# Patient Record
Sex: Female | Born: 1960 | Race: Black or African American | Hispanic: No | Marital: Married | Smoking: Former smoker
Health system: Southern US, Community
[De-identification: ages and names within clinical notes are randomized; demographics above are authoritative.]

## PROBLEM LIST (undated history)

## (undated) DIAGNOSIS — J189 Pneumonia, unspecified organism: Secondary | ICD-10-CM

## (undated) DIAGNOSIS — E119 Type 2 diabetes mellitus without complications: Secondary | ICD-10-CM

## (undated) DIAGNOSIS — G4730 Sleep apnea, unspecified: Secondary | ICD-10-CM

## (undated) DIAGNOSIS — D649 Anemia, unspecified: Secondary | ICD-10-CM

## (undated) DIAGNOSIS — I10 Essential (primary) hypertension: Secondary | ICD-10-CM

## (undated) HISTORY — PX: CHOLECYSTECTOMY: SHX55

## (undated) HISTORY — PX: TONSILLECTOMY: SUR1361

## (undated) HISTORY — PX: TUBAL LIGATION: SHX77

## (undated) HISTORY — DX: Anemia, unspecified: D64.9

## (undated) HISTORY — DX: Type 2 diabetes mellitus without complications: E11.9

---

## 2003-04-30 ENCOUNTER — Encounter: Payer: Self-pay | Admitting: Emergency Medicine

## 2003-04-30 ENCOUNTER — Emergency Department (HOSPITAL_COMMUNITY): Admission: EM | Admit: 2003-04-30 | Discharge: 2003-04-30 | Payer: Self-pay | Admitting: Emergency Medicine

## 2008-10-21 ENCOUNTER — Emergency Department (HOSPITAL_BASED_OUTPATIENT_CLINIC_OR_DEPARTMENT_OTHER): Admission: EM | Admit: 2008-10-21 | Discharge: 2008-10-21 | Payer: Self-pay | Admitting: Emergency Medicine

## 2009-08-26 ENCOUNTER — Encounter: Payer: Self-pay | Admitting: Emergency Medicine

## 2009-08-26 ENCOUNTER — Observation Stay (HOSPITAL_COMMUNITY): Admission: EM | Admit: 2009-08-26 | Discharge: 2009-08-27 | Payer: Self-pay | Admitting: Internal Medicine

## 2009-08-26 ENCOUNTER — Ambulatory Visit: Payer: Self-pay | Admitting: Diagnostic Radiology

## 2010-11-03 LAB — CARDIAC PANEL(CRET KIN+CKTOT+MB+TROPI)
CK, MB: 0.9 ng/mL (ref 0.3–4.0)
Relative Index: INVALID (ref 0.0–2.5)
Total CK: 47 U/L (ref 7–177)
Total CK: 52 U/L (ref 7–177)
Troponin I: 0.01 ng/mL (ref 0.00–0.06)

## 2010-11-03 LAB — RETICULOCYTES: RBC.: 4.88 MIL/uL (ref 3.87–5.11)

## 2010-11-03 LAB — BASIC METABOLIC PANEL
Creatinine, Ser: 0.8 mg/dL (ref 0.4–1.2)
GFR calc Af Amer: >60 mL/min (ref >60)
Potassium: 4.3 mEq/L (ref 3.5–5.1)

## 2010-11-03 LAB — LIPID PANEL
Cholesterol: 112 mg/dL (ref 0–200)
LDL Cholesterol: 66 mg/dL (ref 0–99)
Triglycerides: 64 mg/dL (ref <150)
VLDL: 13 mg/dL (ref 0–40)

## 2010-11-03 LAB — CBC
HCT: 34.9 % — ABNORMAL LOW (ref 36.0–46.0)
Hemoglobin: 11.3 g/dL — ABNORMAL LOW (ref 12.0–15.0)
MCHC: 31.9 g/dL (ref 30.0–36.0)
MCHC: 31.5 g/dL (ref 30.0–36.0)
MCV: 67.4 fL — ABNORMAL LOW (ref 78.0–100.0)
MCV: 68.5 fL — ABNORMAL LOW (ref 78.0–100.0)
Platelets: 253 10*3/uL (ref 150–400)
Platelets: 243 10*3/uL (ref 150–400)
RBC: 5.26 MIL/uL — ABNORMAL HIGH (ref 3.87–5.11)
RDW: 17.1 % — ABNORMAL HIGH (ref 11.5–15.5)
RDW: 18.9 % — ABNORMAL HIGH (ref 11.5–15.5)
WBC: 12.6 10*3/uL — ABNORMAL HIGH (ref 4.0–10.5)

## 2010-11-03 LAB — IRON AND TIBC
Iron: 21 ug/dL — ABNORMAL LOW (ref 42–135)
Saturation Ratios: 7 % — ABNORMAL LOW (ref 20–55)
UIBC: 266 ug/dL

## 2010-11-03 LAB — DIFFERENTIAL
Basophils Absolute: 0.1 10*3/uL (ref 0.0–0.1)
Eosinophils Absolute: 0.5 10*3/uL (ref 0.0–0.7)
Lymphocytes Relative: 31 % (ref 12–46)
Monocytes Absolute: 0.8 10*3/uL (ref 0.1–1.0)
Neutro Abs: 7.3 10*3/uL (ref 1.7–7.7)

## 2010-11-03 LAB — POCT CARDIAC MARKERS
CKMB, poc: <1 ng/mL — ABNORMAL LOW (ref 1.0–8.0)
Myoglobin, poc: 33.2 ng/mL (ref 12–200)
Myoglobin, poc: 60.2 ng/mL (ref 12–200)
Troponin i, poc: <0.05 ng/mL (ref 0.00–0.09)

## 2010-11-03 LAB — VITAMIN B12: Vitamin B-12: 309 pg/mL (ref 211–911)

## 2010-11-03 LAB — FERRITIN: Ferritin: 23 ng/mL (ref 10–291)

## 2010-11-28 LAB — CBC
Hemoglobin: 12 g/dL (ref 12.0–15.0)
MCHC: 31.7 g/dL (ref 30.0–36.0)
Platelets: 264 10*3/uL (ref 150–400)
RDW: 17.9 % — ABNORMAL HIGH (ref 11.5–15.5)

## 2010-11-28 LAB — GC/CHLAMYDIA PROBE AMP, GENITAL: GC Probe Amp, Genital: NEGATIVE

## 2010-11-28 LAB — URINALYSIS, ROUTINE W REFLEX MICROSCOPIC
Ketones, ur: NEGATIVE mg/dL
Nitrite: NEGATIVE
Protein, ur: NEGATIVE mg/dL
Urobilinogen, UA: 1 mg/dL (ref 0.0–1.0)

## 2010-11-28 LAB — PREGNANCY, URINE: Preg Test, Ur: NEGATIVE

## 2010-11-28 LAB — WET PREP, GENITAL

## 2015-11-15 DIAGNOSIS — K219 Gastro-esophageal reflux disease without esophagitis: Secondary | ICD-10-CM | POA: Insufficient documentation

## 2015-11-15 DIAGNOSIS — J45909 Unspecified asthma, uncomplicated: Secondary | ICD-10-CM

## 2015-11-15 DIAGNOSIS — G4733 Obstructive sleep apnea (adult) (pediatric): Secondary | ICD-10-CM | POA: Insufficient documentation

## 2015-11-15 DIAGNOSIS — R002 Palpitations: Secondary | ICD-10-CM | POA: Insufficient documentation

## 2015-11-15 DIAGNOSIS — R4701 Aphasia: Secondary | ICD-10-CM | POA: Insufficient documentation

## 2015-11-15 HISTORY — DX: Gastro-esophageal reflux disease without esophagitis: K21.9

## 2015-11-15 HISTORY — DX: Unspecified asthma, uncomplicated: J45.909

## 2015-11-26 DIAGNOSIS — N926 Irregular menstruation, unspecified: Secondary | ICD-10-CM | POA: Insufficient documentation

## 2016-10-24 DIAGNOSIS — M170 Bilateral primary osteoarthritis of knee: Secondary | ICD-10-CM | POA: Insufficient documentation

## 2016-10-24 DIAGNOSIS — G8929 Other chronic pain: Secondary | ICD-10-CM | POA: Insufficient documentation

## 2016-10-26 ENCOUNTER — Encounter (HOSPITAL_BASED_OUTPATIENT_CLINIC_OR_DEPARTMENT_OTHER): Payer: Self-pay | Admitting: Emergency Medicine

## 2016-10-26 ENCOUNTER — Emergency Department (HOSPITAL_BASED_OUTPATIENT_CLINIC_OR_DEPARTMENT_OTHER)
Admission: EM | Admit: 2016-10-26 | Discharge: 2016-10-26 | Disposition: A | Discharge status: Home / Self Care | Payer: BLUE CROSS/BLUE SHIELD | Attending: Emergency Medicine | Admitting: Emergency Medicine

## 2016-10-26 DIAGNOSIS — L509 Urticaria, unspecified: Secondary | ICD-10-CM | POA: Insufficient documentation

## 2016-10-26 DIAGNOSIS — T39395A Adverse effect of other nonsteroidal anti-inflammatory drugs [NSAID], initial encounter: Secondary | ICD-10-CM | POA: Diagnosis not present

## 2016-10-26 DIAGNOSIS — Z87891 Personal history of nicotine dependence: Secondary | ICD-10-CM | POA: Insufficient documentation

## 2016-10-26 DIAGNOSIS — T7840XA Allergy, unspecified, initial encounter: Secondary | ICD-10-CM

## 2016-10-26 MED ORDER — PREDNISONE 20 MG PO TABS: ORAL_TABLET | ORAL | 0 refills | Status: DC

## 2016-10-26 MED ORDER — FAMOTIDINE IN NACL 20-0.9 MG/50ML-% IV SOLN
20.0000 mg | Freq: Once | INTRAVENOUS | Status: AC
  Administered 2016-10-26: 20 mg via INTRAVENOUS
  Filled 2016-10-26: qty 50

## 2016-10-26 MED ORDER — RANITIDINE HCL 150 MG PO TABS: 150.0000 mg | ORAL_TABLET | Freq: Two times a day (BID) | ORAL | 0 refills | Status: DC

## 2016-10-26 MED ORDER — DIPHENHYDRAMINE HCL 25 MG PO TABS: 50.0000 mg | ORAL_TABLET | Freq: Four times a day (QID) | ORAL | 0 refills | Status: DC | PRN

## 2016-10-26 MED ORDER — DIPHENHYDRAMINE HCL 50 MG/ML IJ SOLN
50.0000 mg | Freq: Once | INTRAMUSCULAR | Status: AC
  Administered 2016-10-26: 50 mg via INTRAVENOUS
  Filled 2016-10-26: qty 1

## 2016-10-26 MED ORDER — METHYLPREDNISOLONE SODIUM SUCC 125 MG IJ SOLR
125.0000 mg | Freq: Once | INTRAMUSCULAR | Status: AC
  Administered 2016-10-26: 125 mg via INTRAVENOUS
  Filled 2016-10-26: qty 2

## 2016-10-26 NOTE — Discharge Instructions (Signed)
Take Prednisone, Benadryl, and zantac as prescribed (starting prednisone tomorrow, but the rest can begin today). Continue your usual home medications. Get plenty of rest and drink plenty of fluids. Avoid any known triggers. STOP TAKING MELOXICAM! Please followup with your primary doctor in 3-5 days for recheck of symptoms. Return to the ER for changes or worsening symptoms

## 2016-10-26 NOTE — ED Triage Notes (Signed)
Pt sts her face was hot and lips tingling last night after taking Meloxicam; sxs continued this a.m. [facility]

## 2016-10-26 NOTE — ED Provider Notes (Signed)
[facility] Provider Note   CSN: [id number] Arrival date & time: 10/26/16  4098     History   Chief Complaint Chief Complaint  Patient presents with  . Allergic Reaction    HPI Hannah Figueroa is a 56 y.o. female with a PMHx of GERD, asthma, morbid obesity, OSA, and b/l knee pain, who presents to the ED with complaints of allergic reaction after taking mobic around 2 PM yesterday afternoon. Patient states that about 2 hours after taking Mobic, she developed upper chest and face itching and burning and a rash/hives, as well as bilateral upper eyelid and lip swelling. Today she continues to have the symptoms, and has noticed some mild shortness of breath and wheezing. She has not tried anything for her symptoms, and no known aggravating factors aside from taking mobic. She has had an allergic reaction to antibiotic when she was a teenager, cannot recall the name of the antibiotic, but recalls that it was not the same as this reaction given that she did not have any facial swelling with that reaction. She denies any other changes in soaps, detergents, lotions, creams, cosmetics, animal or plant contact, foods, or any other changes in her daily routine aside from the new medication. She denies any tongue swelling, drooling, trismus, eye drainage or redness, vision changes, fevers, chills, CP, abd pain, N/V/D/C, hematuria, dysuria, myalgias, arthralgias, numbness, tingling, focal weakness, or any other complaints at this time.    The history is provided by the patient and medical records. No language interpreter was used.  Allergic Reaction  Presenting symptoms: itching, rash, swelling and wheezing   Presenting symptoms: no difficulty swallowing   Severity:  Mild Duration:  20 hours Prior allergic episodes:  Allergies to medications Context: medications   Context: not animal exposure, not cosmetics, not food allergies and not new detergents/soaps   Relieved by:  None tried Worsened  by:  Nothing Ineffective treatments:  None tried   History reviewed. No pertinent past medical history.  There are no active problems to display for this patient.   Past Surgical History:  Procedure Laterality Date  . CESAREAN SECTION    . CHOLECYSTECTOMY      OB History    No data available       Home Medications    Prior to Admission medications   Not on File    Family History History reviewed. No pertinent family history.  Social History Social History  Substance Use Topics  . Smoking status: Former Games developer  . Smokeless tobacco: Never Used  . Alcohol use No     Comment: soc     Allergies   Meloxicam   Review of Systems Review of Systems  Constitutional: Negative for chills and fever.  HENT: Positive for facial swelling. Negative for drooling and trouble swallowing.   Eyes: Negative for discharge, redness and visual disturbance.  Respiratory: Positive for shortness of breath and wheezing.   Cardiovascular: Negative for chest pain.  Gastrointestinal: Negative for abdominal pain, constipation, diarrhea, nausea and vomiting.  Genitourinary: Negative for dysuria and hematuria.  Musculoskeletal: Negative for arthralgias and myalgias.  Skin: Positive for itching and rash.  Allergic/Immunologic: Negative for immunocompromised state.  Neurological: Negative for weakness and numbness.  Psychiatric/Behavioral: Negative for confusion.   10 Systems reviewed and are negative for acute change except as noted in the HPI.   Physical Exam Updated Vital Signs BP 198/96   Pulse 87   Temp 98.1 F (36.7 C) (Oral)   Resp 20  Ht 5\' 5"  (1.651 m)   Wt (!) 172.4 kg   LMP 10/19/2016   SpO2 100%   BMI 63.24 kg/m   Re-check VS: BP (!) 133/111 (BP Location: Right Wrist) Comment: pt laughing during BP cuff infaltion  Pulse 80   Temp 98.1 F (36.7 C) (Oral)   Resp 20   Ht 5\' 5"  (1.651 m)   Wt (!) 172.4 kg   LMP 10/19/2016   SpO2 99%   BMI 63.24 kg/m     Physical Exam  Constitutional: She is oriented to person, place, and time. She appears well-developed and well-nourished.  Non-toxic appearance. No distress.  Afebrile, nontoxic, NAD, BP elevated initially  HENT:  Head: Normocephalic and atraumatic.  Nose: Nose normal.  Mouth/Throat: Uvula is midline, oropharynx is clear and moist and mucous membranes are normal. No trismus in the jaw. No uvula swelling.  Bilateral upper eyelids mildly swollen, no other significant facial swelling noted, tongue and lips unremarkable appearing without significant swelling. Airway patent. Nose clear. Oropharynx clear and moist, without uvular swelling or deviation, no trismus or drooling, no tonsillar swelling or erythema, no exudates.    Eyes: Conjunctivae and EOM are normal. Pupils are equal, round, and reactive to light. Right eye exhibits no chemosis and no discharge. Left eye exhibits no chemosis and no discharge.  B/l upper eyelids mildly swollen. Conjunctiva clear bilaterally. PERRL, EOMI, no nystagmus. No ocular drainage  Neck: Normal range of motion. Neck supple.  Cardiovascular: Normal rate, regular rhythm, normal heart sounds and intact distal pulses.  Exam reveals no gallop and no friction rub.   No murmur heard. Pulmonary/Chest: Effort normal and breath sounds normal. No respiratory distress. She has no decreased breath sounds. She has no wheezes. She has no rhonchi. She has no rales.  CTAB in all lung fields, no w/r/r, no hypoxia or increased WOB, speaking in full sentences, SpO2 100% on RA   Abdominal: Soft. Normal appearance and bowel sounds are normal. She exhibits no distension. There is no tenderness. There is no rigidity, no rebound, no guarding, no CVA tenderness, no tenderness at McBurney's point and negative Murphy's sign.  Musculoskeletal: Normal range of motion.  Neurological: She is alert and oriented to person, place, and time. She has normal strength. No sensory deficit.  Skin: Skin is  warm, dry and intact. Rash noted. Rash is urticarial.  Faint urticarial rash to upper chest and neck  Psychiatric: She has a normal mood and affect.  Nursing note and vitals reviewed.    ED Treatments / Results  Labs (all labs ordered are listed, but only abnormal results are displayed) Labs Reviewed - No data to display  EKG  EKG Interpretation None       Radiology No results found.  Procedures Procedures (including critical care time)  Medications Ordered in ED Medications  methylPREDNISolone sodium succinate (SOLU-MEDROL) 125 mg/2 mL injection 125 mg (125 mg Intravenous Given 10/26/16 0959)  famotidine (PEPCID) IVPB 20 mg premix (0 mg Intravenous Stopped 10/26/16 1048)  diphenhydrAMINE (BENADRYL) injection 50 mg (50 mg Intravenous Given 10/26/16 0955)     Initial Impression / Assessment and Plan / ED Course  I have reviewed the triage vital signs and the nursing notes.  Pertinent labs & imaging results that were available during my care of the patient were reviewed by me and considered in my medical decision making (see chart for details).     56 y.o. female here with allergic reaction after taking mobic yesterday. Rash and itching/burning  to face and upper chest/neck, slight urticarial rash to these areas, mild swelling to upper eyelids bilaterally. Airway patent, clear lung sounds, no hypoxia, no tongue swelling noted. BP noted to be elevated at 198/96, no prior BP recordings this high from outpatient records, highest recorded was recently 142/82. Could be from anxiety of situation, will allow to relax for a few minutes and recheck. Will give solumedrol, benadryl, and pepcid, then reassess shortly.   12:35 PM BP 130s/100s on recheck, pt laughing during BP check, but then rechecked at 131/65 on subsequent checks; overall improved from prior so doubt that initial BP was truly accurate or any cause for concern. Overall symptoms not worsening, airway still patent, handling  secretions well, no progression of symptoms. Will d/c home with prednisone x4 more days starting tomorrow, and zantac/benadryl to take for total of 5 days. Discussed stopping mobic, and avoiding triggers. Strict return precautions advised. I explained the diagnosis and have given explicit precautions to return to the ER including for any other new or worsening symptoms. The patient understands and accepts the medical plan as it's been dictated and I have answered their questions. Discharge instructions concerning home care and prescriptions have been given. The patient is STABLE and is discharged to home in good condition.   Final Clinical Impressions(s) / ED Diagnoses   Final diagnoses:  Allergic reaction to drug, initial encounter  Hives    New Prescriptions New Prescriptions   DIPHENHYDRAMINE (BENADRYL) 25 MG TABLET    Take 2 tablets (50 mg total) by mouth every 6 (six) hours as needed for itching or allergies.   PREDNISONE (DELTASONE) 20 MG TABLET    3 tabs po daily x 4 days starting 10/27/16   RANITIDINE (ZANTAC) 150 MG TABLET    Take 1 tablet (150 mg total) by mouth 2 (two) times daily. x5 days     [id number]Zebulan Hinshaw, PA-C 10/26/16 1236    Pricilla LovelessScott Goldston, MD 10/29/16 [phone]

## 2017-02-28 ENCOUNTER — Emergency Department (HOSPITAL_BASED_OUTPATIENT_CLINIC_OR_DEPARTMENT_OTHER)
Admission: EM | Admit: 2017-02-28 | Discharge: 2017-02-28 | Disposition: A | Discharge status: Home / Self Care | Payer: BLUE CROSS/BLUE SHIELD | Attending: Emergency Medicine | Admitting: Emergency Medicine

## 2017-02-28 ENCOUNTER — Encounter (HOSPITAL_BASED_OUTPATIENT_CLINIC_OR_DEPARTMENT_OTHER): Payer: Self-pay | Admitting: Emergency Medicine

## 2017-02-28 DIAGNOSIS — Z87891 Personal history of nicotine dependence: Secondary | ICD-10-CM | POA: Insufficient documentation

## 2017-02-28 DIAGNOSIS — W57XXXA Bitten or stung by nonvenomous insect and other nonvenomous arthropods, initial encounter: Secondary | ICD-10-CM | POA: Diagnosis not present

## 2017-02-28 DIAGNOSIS — L03115 Cellulitis of right lower limb: Secondary | ICD-10-CM | POA: Diagnosis not present

## 2017-02-28 DIAGNOSIS — M25571 Pain in right ankle and joints of right foot: Secondary | ICD-10-CM | POA: Diagnosis present

## 2017-02-28 MED ORDER — TRIAMCINOLONE ACETONIDE 0.1 % EX CREA: 1.0000 "application " | TOPICAL_CREAM | Freq: Two times a day (BID) | CUTANEOUS | 0 refills | Status: DC

## 2017-02-28 MED ORDER — SULFAMETHOXAZOLE-TRIMETHOPRIM 800-160 MG PO TABS
1.0000 | ORAL_TABLET | Freq: Once | ORAL | Status: AC
  Administered 2017-02-28: 1 via ORAL
  Filled 2017-02-28: qty 1

## 2017-02-28 MED ORDER — SULFAMETHOXAZOLE-TRIMETHOPRIM 800-160 MG PO TABS
1.0000 | ORAL_TABLET | Freq: Two times a day (BID) | ORAL | 0 refills | Status: AC
Start: 2017-02-28 — End: 2017-03-07

## 2017-02-28 MED ORDER — LIDOCAINE-EPINEPHRINE (PF) 2 %-1:200000 IJ SOLN
10.0000 mL | Freq: Once | INTRAMUSCULAR | Status: AC
  Administered 2017-02-28: 10 mL via INTRADERMAL
  Filled 2017-02-28: qty 10

## 2017-02-28 MED ORDER — HYDROCODONE-ACETAMINOPHEN 5-325 MG PO TABS
1.0000 | ORAL_TABLET | Freq: Once | ORAL | Status: AC
Start: 2017-02-28 — End: 2017-02-28
  Administered 2017-02-28: 1 via ORAL
  Filled 2017-02-28: qty 1

## 2017-02-28 NOTE — ED Triage Notes (Signed)
Pt c/o pain and itching to RT ankle after spider bite on Thurs

## 2017-02-28 NOTE — Discharge Instructions (Signed)
Please take all of your antibiotics until finished!   You may develop abdominal discomfort or diarrhea from the antibiotic.  You may help offset this with probiotics which you can buy or get in yogurt. Do not eat  or take the probiotics until 2 hours after your antibiotic.   Keep the area clean and dry. Apply steroid cream twice daily for itching. You may also take Benadryl as needed for itching. Apply ice or heat to the affected area for comfort. Alternate 600 mg of ibuprofen and [phone] mg of Tylenol every 3 hours as needed for pain. Do not exceed 4000 mg of Tylenol daily. Follow-up with your primary care physician in 3 days for reevaluation. Return to the ED immediately if you develop any concerning signs or symptoms such as worsening/spreading of the swelling or redness, fevers, or abnormal drainage.

## 2017-02-28 NOTE — ED Provider Notes (Signed)
[facility] Note   CSN: [id number] Arrival date & time: 02/28/17  1833   By signing my name below, I, [name], attest that this documentation has been prepared under the direction and in the presence of Michela Pitcher, PA-C Electronically Signed: [name], ED Scribe. 02/28/17. 8:48 PM.  History   Chief Complaint Chief Complaint  Patient presents with  . Insect Bite    HPI Hannah Figueroa is a 56 y.o. female who presents to the Emergency Department complaining of possible insect bite to her right ankle onset 2 days ago. Pt reports associated right ankle pain, drainage localized to right ankle, pruritis to right ankle, right foot pain, right ankle swelling, and nausea. Pt has tried OTC abx ointment, witch hazel, and rubbing alcohol with no relief of her symptoms. She notes that she is unsure of what she was bit by, but felt an insect to her right ankle prior to the onset of her symptoms. She notes that her right ankle pain radiates to her right toes and calf. She denies numbness, tingling, color change, vomiting, and any other symptoms.    The history is provided by the patient. No language interpreter was used.    History reviewed. No pertinent past medical history.  There are no active problems to display for this patient.   Past Surgical History:  Procedure Laterality Date  . CESAREAN SECTION    . CHOLECYSTECTOMY      OB History    No data available       Home Medications    Prior to Admission medications   Medication Sig Start Date End Date Taking? Authorizing Provider  diphenhydrAMINE (BENADRYL) 25 MG tablet Take 2 tablets (50 mg total) by mouth every 6 (six) hours as needed for itching or allergies. 10/26/16 10/31/16  Street, Mercedes, PA-C  predniSONE (DELTASONE) 20 MG tablet 3 tabs po daily x 4 days starting 10/27/16 10/26/16   Street, [place], PA-C  ranitidine (ZANTAC) 150 MG tablet Take 1 tablet (150 mg total) by mouth 2 (two) times daily.  x5 days 10/26/16 10/31/16  Street, [place], PA-C  sulfamethoxazole-trimethoprim (BACTRIM DS,SEPTRA DS) 800-160 MG tablet Take 1 tablet by mouth 2 (two) times daily. 02/28/17 03/07/17  Michela Pitcher A, PA-C  triamcinolone cream (KENALOG) 0.1 % Apply 1 application topically 2 (two) times daily. 02/28/17   Jeanie Sewer, PA-C    Family History No family history on file.  Social History Social History  Substance Use Topics  . Smoking status: Former Games developer  . Smokeless tobacco: Never Used  . Alcohol use No     Comment: soc     Allergies   Meloxicam and Penicillins   Review of Systems Review of Systems  Gastrointestinal: Positive for nausea. Negative for vomiting.  Musculoskeletal: Positive for arthralgias (right ankle and right toes), joint swelling (right ankle) and myalgias (right calf).  Skin: Negative for color change.       +possible insect bite to right ankle.  +drainage to right ankle +blisters to right ankle     Physical Exam Updated Vital Signs BP 139/70   Pulse 87   Temp 98.2 F (36.8 C) (Oral)   Resp 20   LMP 02/28/2017   SpO2 95%   Physical Exam  Constitutional: She appears well-developed and well-nourished. No distress.  HENT:  Head: Normocephalic and atraumatic.  Eyes: Conjunctivae are normal. Right eye exhibits no discharge. Left eye exhibits no discharge.  Neck: No JVD present. No tracheal deviation present.  Cardiovascular: Normal rate.   Pulmonary/Chest: Effort normal.  Abdominal: She exhibits no distension.  Musculoskeletal: She exhibits no edema.       Right ankle: She exhibits swelling. She exhibits normal range of motion. Tenderness.       Right foot: There is tenderness. There is normal range of motion.  Right ankle with 1 cm area of blistering and underlying purulence inferior to the lateral malleolus. There is surrounding swelling, fluctuance, erythema, warmth, and tenderness. Generalized TTP of right ankle and foot. No deformity or crepitus. Nl  ROM of ankle. 5/5 strength of right foot, ankle, and EHL. Medial to the large blister are 3-4 smaller blisters.   Neurological: She is alert.  Fluent speech, no facial droop, and sensation intact to soft touch of bilateral lower extremities  Skin: No erythema.  Psychiatric: She has a normal mood and affect. Her behavior is normal.  Nursing note and vitals reviewed.    ED Treatments / Results  DIAGNOSTIC STUDIES: Oxygen Saturation is 95% on RA, adeqaute by my interpretation.    COORDINATION OF CARE: 7:51 PM Discussed treatment plan with pt at bedside which includes I&D and pt agreed to plan.   Labs (all labs ordered are listed, but only abnormal results are displayed) Labs Reviewed - No data to display  EKG  EKG Interpretation None       Radiology No results found.  Procedures .Marland KitchenIncision and Drainage Date/Time: 02/28/2017 7:56 PM Performed by: Michela Pitcher A Authorized by: Michela Pitcher A   Consent:    Consent obtained:  Verbal   Consent given by:  Patient   Risks discussed:  Incomplete drainage, pain and infection Location:    Type:  Abscess   Location:  Lower extremity   Lower extremity location:  Ankle   Ankle location:  R ankle Anesthesia (see MAR for exact dosages):    Anesthesia method:  Local infiltration   Local anesthetic:  Lidocaine 2% WITH epi (2 ml used) Procedure type:    Complexity:  Simple Procedure details:    Needle aspiration: no     Incision types:  Single straight   Incision depth:  Dermal   Scalpel blade:  11   Wound management:  Irrigated with saline and probed and deloculated   Drainage:  Purulent   Drainage amount:  Scant   Wound treatment:  Wound left open   Packing materials:  None Post-procedure details:    Patient tolerance of procedure:  Tolerated well, no immediate complications  [place] bedside Date/Time: 02/28/2017 8:46 PM Performed by: Michela Pitcher A Authorized by: Michela Pitcher A  Consent: Verbal consent obtained. Risks and  benefits: risks, benefits and alternatives were discussed Consent given by: patient Patient understanding: patient states understanding of the procedure being performed Patient consent: the patient's understanding of the procedure matches consent given Procedure consent: procedure consent matches procedure scheduled Relevant documents: relevant documents present and verified Test results: test results not available Site marked: the operative site was not marked Imaging studies: imaging studies available Patient identity confirmed: verbally with patient, arm band, provided demographic data and hospital-assigned identification number Time out: Immediately prior to procedure a "time out" was called to verify the correct patient, procedure, equipment, support staff and site/side marked as required. Preparation: Patient was prepped and draped in the usual sterile fashion. Local anesthesia used: no  Anesthesia: Local anesthesia used: no  Sedation: Patient sedated: no Patient tolerance: Patient tolerated the procedure well with no immediate complications Comments: Cobblestoning of superficial skin. No  definitive abscess.     (including critical care time)  Medications Ordered in ED Medications  lidocaine-EPINEPHrine (XYLOCAINE W/EPI) 2 %-1:200000 (PF) injection 10 mL (not administered)  sulfamethoxazole-trimethoprim (BACTRIM DS,SEPTRA DS) 800-160 MG per tablet 1 tablet (not administered)  HYDROcodone-acetaminophen (NORCO/VICODIN) 5-325 MG per tablet 1 tablet (not administered)     Initial Impression / Assessment and Plan / ED Course  I have reviewed the triage vital signs and the nursing notes.  Pertinent labs & imaging results that were available during my care of the patient were reviewed by me and considered in my medical decision making (see chart for details).     Patient presentation consistent with cellulitis. Afebrile. No tachycardia, hypotension or other symptoms suggestive  of severe infection. Attempted incision and drainage with scant drainage expressed. Bedside ultrasound shows changes consistent with cellulitis. First dose Bactrim given while in the ED. She is stable for discharge home with Bactrim and steroid cream for itching. Advised of indications to return to the ED including worsening systemic symptoms, new lymphangitis, or significant spread of erythema. Recommend follow-up with primary care in the next 2-3 days for reevaluation and wound check. Pain management while in the ED. Pt verbalized understanding of and agreement with plan and is safe for discharge home at this time.   Final Clinical Impressions(s) / ED Diagnoses   Final diagnoses:  Insect bite, initial encounter  Cellulitis of right lower extremity    New Prescriptions New Prescriptions   SULFAMETHOXAZOLE-TRIMETHOPRIM (BACTRIM DS,SEPTRA DS) 800-160 MG TABLET    Take 1 tablet by mouth 2 (two) times daily.   TRIAMCINOLONE CREAM (KENALOG) 0.1 %    Apply 1 application topically 2 (two) times daily.   I personally performed the services described in this documentation, which was scribed in my presence. The recorded information has been reviewed and is accurate.     Jeanie SewerFawze, Tinlee Navarrette A, PA-C 03/01/17 [id number]    Geoffery Lyonselo, [place], MD 03/01/17 [phone]

## 2017-11-26 ENCOUNTER — Ambulatory Visit: Payer: Self-pay

## 2017-11-26 NOTE — Telephone Encounter (Signed)
Patient called with c/o "leg burning." She says "it started about a month ago and it's off and on, but it keeps happening and I am leaving to go on a plane next week. I wanted to make sure it is not a blood clot in my leg. I don't have pain, it's just a burning sensation at the left side of my calf. There is no swelling, no redness, slight tenderness and when there is burning, it is warm to touch. Otherwise, no warmth is felt." I asked about SOB, fever, chest pain, back pain, numbness, weakness, she says "no to all." According to protocol, see PCP within 2 weeks, [facility] to [place], appointment is scheduled for Monday, 11/30/17 at 1000 with Esperanza RichtersEdward Saguier, [facility], care advice given, patient verbalized understanding.   Reason for Disposition . [1] MILD pain (e.g., does not interfere with normal activities) AND [2] present > 7 days  Answer Assessment - Initial Assessment Questions 1. ONSET: "When did the pain start?"      1 month ago 2. LOCATION: "Where is the pain located?"     Left calf, to side 3. PAIN: "How bad is the pain?"    (Scale 1-10; or mild, moderate, severe)   -  MILD (1-3): doesn't interfere with normal activities    -  MODERATE (4-7): interferes with normal activities (e.g., work or school) or awakens from sleep, limping    -  SEVERE (8-10): excruciating pain, unable to do any normal activities, unable to walk     Burning sensation off and on about a 3-4 4. WORK OR EXERCISE: "Has there been any recent work or exercise that involved this part of the body?"      No 5. CAUSE: "What do you think is causing the leg pain?"     I don't know 6. OTHER SYMPTOMS: "Do you have any other symptoms?" (e.g., chest pain, back pain, breathing difficulty, swelling, rash, fever, numbness, weakness)     No 7. PREGNANCY: "Is there any chance you are pregnant?" "When was your last menstrual period?"     No  Protocols used: LEG PAIN-A-AH

## 2017-11-30 ENCOUNTER — Encounter: Payer: Self-pay | Admitting: Medical

## 2017-11-30 ENCOUNTER — Ambulatory Visit: Payer: 59 | Admitting: Medical

## 2017-11-30 VITALS — BP 150/80 | HR 80 | Temp 98.2°F | Resp 16 | Ht 65.0 in | Wt 356.8 lb

## 2017-11-30 DIAGNOSIS — G8929 Other chronic pain: Secondary | ICD-10-CM

## 2017-11-30 DIAGNOSIS — M79662 Pain in left lower leg: Secondary | ICD-10-CM

## 2017-11-30 DIAGNOSIS — R030 Elevated blood-pressure reading, without diagnosis of hypertension: Secondary | ICD-10-CM

## 2017-11-30 DIAGNOSIS — F419 Anxiety disorder, unspecified: Secondary | ICD-10-CM

## 2017-11-30 DIAGNOSIS — Z6841 Body Mass Index (BMI) 40.0 and over, adult: Secondary | ICD-10-CM | POA: Diagnosis not present

## 2017-11-30 DIAGNOSIS — M25562 Pain in left knee: Secondary | ICD-10-CM

## 2017-11-30 DIAGNOSIS — F40243 Fear of flying: Secondary | ICD-10-CM | POA: Diagnosis not present

## 2017-11-30 MED ORDER — LORAZEPAM 0.5 MG PO TABS: 0.5000 mg | ORAL_TABLET | Freq: Two times a day (BID) | ORAL | 0 refills | Status: DC | PRN

## 2017-11-30 NOTE — Progress Notes (Signed)
Subjective:    Patient ID: Hannah Figueroa, female    DOB: 08/31/1960, 57 y.o.   MRN: [id number]  HPI  Pt in for first time.  Pt admits obese. But no major medical problems. Pt tends to avoid providers/expresses reluctant to see in past.  Hx of both knee pains. Pt lost 40 lbs since December and feels better. Following program called 56 day diet.   Allergic reaction to meloxicam in the past. She took it for knee pain in the past. But can take ibuprofen.   She does report calf burning and pain. Anterior aspect small area very tender to palpation. She thinks can feel small swollen and tender vein. This has been present for weeks.  Pt has extreme fear of heights and she has never flown. This Wednesday is flying to [place].She wants med available to use in event she has panic attack. No hx of benzodiazepene use.  No hx of htn. Though bp is increased today.    Review of Systems  Constitutional: Negative for chills, fatigue and fever.  Respiratory: Negative for cough, chest tightness, shortness of breath and wheezing.   Cardiovascular: Negative for chest pain.  Gastrointestinal: Negative for abdominal pain, diarrhea and rectal pain.  Musculoskeletal: Negative for back pain and neck pain.       Left anterior tibial pain on palpation.   Left knee pain.  Skin: Negative for rash.  Neurological: Negative for dizziness, speech difficulty, weakness and headaches.  Hematological: Negative for adenopathy. Does not bruise/bleed easily.  Psychiatric/Behavioral: Negative for behavioral problems, confusion, dysphoric mood, self-injury and suicidal ideas. The patient is nervous/anxious.     Past Medical History:  Diagnosis Date  . Anemia   . Diabetes mellitus without complication [facility])      Social History   Socioeconomic History  . Marital status: Married    Spouse name: Not on file  . Number of children: Not on file  . Years of education: Not on file  . Highest education level: Not on  file  Occupational History  . Occupation: Orthoptist  Social Needs  . Financial resource strain: Not on file  . Food insecurity:    Worry: Not on file    Inability: Not on file  . Transportation needs:    Medical: Not on file    Non-medical: Not on file  Tobacco Use  . Smoking status: Former Smoker    Years: 20.00  . Smokeless tobacco: Never Used  Substance and Sexual Activity  . Alcohol use: No    Comment: soc  . Drug use: No  . Sexual activity: Yes  Lifestyle  . Physical activity:    Days per week: Not on file    Minutes per session: Not on file  . Stress: Not on file  Relationships  . Social connections:    Talks on phone: Not on file    Gets together: Not on file    Attends religious service: Not on file    Active member of club or organization: Not on file    Attends meetings of clubs or organizations: Not on file    Relationship status: Not on file  . Intimate partner violence:    Fear of current or ex partner: Not on file    Emotionally abused: Not on file    Physically abused: Not on file    Forced sexual activity: Not on file  Other Topics Concern  . Not on file  Social History Narrative  .  Not on file    Past Surgical History:  Procedure Laterality Date  . CESAREAN SECTION    . CHOLECYSTECTOMY      History reviewed. No pertinent family history.  Allergies  Allergen Reactions  . Meloxicam   . Penicillins     No current outpatient medications on file prior to visit.   No current facility-administered medications on file prior to visit.     BP (!) 150/80   Pulse 80   Temp 98.2 F (36.8 C) (Oral)   Resp 16   Ht 5\' 5"  (1.651 m)   Wt (!) 356 lb 12.8 oz (161.8 kg)   SpO2 99%   BMI 59.37 kg/m       Objective:   Physical Exam  General Mental Status- Alert. General Appearance- Not in acute distress. (morbid obese) Pleasant pt.  Skin General: Color- Normal Color. Moisture- Normal Moisture.  Neck Carotid  Arteries- Normal color. Moisture- Normal Moisture. No carotid bruits. No JVD.  Chest and Lung Exam Auscultation: Breath Sounds:-Normal.  Cardiovascular Auscultation:Rythm- Regular. Murmurs & Other Heart Sounds:Auscultation of the heart reveals- No Murmurs.  Abdomen Inspection:-Inspeection Normal. Palpation/Percussion:Note:No mass. Palpation and Percussion of the abdomen reveal- Non Tender, Non Distended + BS, no rebound or guarding.    Neurologic Cranial Nerve exam:- CN III-XII intact(No nystagmus), symmetric smile. Drift Test:- No drift. Romberg Exam:- Negative.  Heal to Toe Gait exam:-Normal. Finger to Nose:- Normal/Intact Strength:- 5/5 equal and symmetric strength both upper and lower extremities.  Left knee- mild pain on flexion and extension.  Left lower extremity. Mild tender to palpation upper pretibial area. Slight palpable short segment of probable vein that is tender. Negative homans signs bilateral. Calves are symmetric.      Assessment & Plan:  For recent anxiety about flying and extreme fear of flying, I am going to make Ativan prescription available.  Discussed on how to use to avoid full-scale panic attack.  For high blood pressure today, I do want you to start checking your blood pressure daily.  I think the best type of monitor for you to get with the wrist monitor.  Check your blood pressure and pulse.  Give us an update in 1 week how your readings are.  If consistently above 140/90 then might consider prescription of BP medication  For history of chronic left knee pain, I do want to get x-ray to evaluate you joint space.  You can take very low dose ibuprofen 200 mg for pain or Tylenol.  But would limit use of ibuprofen as it can affect blood pressure level.  For anterior aspect calf pain/pretibial pain, this might represent superficial phlebitis.  Recommend warm compresses twice daily and low-dose ibuprofen as explained above.  Decided to go ahead and get left  lower extremity ultrasound tomorrow.  For obesity, good job on recent weight loss and continue your 56-day program.  Follow-up in 3 weeks or as needed.  Esperanza RichtersEdward Azoria Abbett, PA-C

## 2017-11-30 NOTE — Patient Instructions (Signed)
For recent anxiety about flying and extreme fear of flying, I am going to make Ativan prescription available.  Discussed on how to use to avoid full-scale panic attack.  For high blood pressure today, I do want you to start checking your blood pressure daily.  I think the best type of monitor for you to get with the wrist monitor.  Check your blood pressure and pulse.  Give us an update in 1 week how your readings are.  If consistently above 140/90 then might consider prescription of BP medication  For history of chronic left knee pain, I do want to get x-ray to evaluate you joint space.  You can take very low dose ibuprofen 200 mg for pain or Tylenol.  But would limit use of ibuprofen as it can affect blood pressure level.  For anterior aspect calf pain/pretibial pain, this might represent superficial phlebitis.  Recommend warm compresses twice daily and low-dose ibuprofen as explained above.  Decided to go ahead and get left lower extremity ultrasound tomorrow.  For obesity, good job on recent weight loss and continue your 56-day program.  Follow-up in 3 weeks or as needed.

## 2017-12-01 ENCOUNTER — Inpatient Hospital Stay (HOSPITAL_BASED_OUTPATIENT_CLINIC_OR_DEPARTMENT_OTHER): Admission: RE | Admit: 2017-12-01 | Payer: 59 | Source: Ambulatory Visit

## 2017-12-01 ENCOUNTER — Ambulatory Visit (HOSPITAL_BASED_OUTPATIENT_CLINIC_OR_DEPARTMENT_OTHER): Admission: RE | Admit: 2017-12-01 | Payer: 59 | Source: Ambulatory Visit

## 2017-12-18 DIAGNOSIS — E119 Type 2 diabetes mellitus without complications: Secondary | ICD-10-CM | POA: Diagnosis not present

## 2017-12-18 DIAGNOSIS — H40013 Open angle with borderline findings, low risk, bilateral: Secondary | ICD-10-CM | POA: Diagnosis not present

## 2018-05-26 ENCOUNTER — Ambulatory Visit: Payer: Self-pay | Admitting: Medical

## 2018-05-26 NOTE — Telephone Encounter (Signed)
Pt called in c/o her BP being elevated when it was checked at work yesterday with a wrist monitor.  It was 180/90.   She also had a headache yesterday. She feels better today as far as the headache goes.   She does not have a way to check her BP at home today but she is going to go have it checked later today somewhere. Towards the end of the call she did mention she is having occasional dizziness.  She is not on BP medication.  See triage notes.  At her request I have scheduled her with Esperanza Richters, PA-C on Friday 05/28/18 at 2:20 PM.     Reason for Disposition . [1] Systolic BP  >= 130 OR Diastolic >= 80 AND [2] not taking BP medications  Answer Assessment - Initial Assessment Questions 1. BLOOD PRESSURE: "What is the blood pressure?" "Did you take at least two measurements 5 minutes apart?"     Yesterday BP 180/90 done at work with a wrist device.  I had a bad headache.   The headache is better today. 2. ONSET: "When did you take your blood pressure?"     Yesterday at work 3. HOW: "How did you obtain the blood pressure?" (e.g., visiting nurse, automatic home BP monitor)     Wrist monitor. 4. HISTORY: "Do you have a history of high blood pressure?"     No.    Last time I was in office it was a little high.   I have not been tracking my BP. 5. MEDICATIONS: "Are you taking any medications for blood pressure?" "Have you missed any doses recently?"     No 6. OTHER SYMPTOMS: "Do you have any symptoms?" (e.g., headache, chest pain, blurred vision, difficulty breathing, weakness)     None of the above 7. PREGNANCY: "Is there any chance you are pregnant?" "When was your last menstrual period?"     I'm having my period now.  Protocols used: HIGH BLOOD PRESSURE-A-AH

## 2018-05-28 ENCOUNTER — Ambulatory Visit (HOSPITAL_BASED_OUTPATIENT_CLINIC_OR_DEPARTMENT_OTHER)
Admission: RE | Admit: 2018-05-28 | Discharge: 2018-05-28 | Disposition: A | Discharge status: Home / Self Care | Payer: 59 | Source: Ambulatory Visit | Attending: Medical | Admitting: Medical

## 2018-05-28 ENCOUNTER — Encounter: Payer: Self-pay | Admitting: Medical

## 2018-05-28 ENCOUNTER — Ambulatory Visit: Payer: 59 | Admitting: Medical

## 2018-05-28 VITALS — BP 162/90 | HR 74 | Temp 98.3°F | Resp 14 | Ht 65.0 in | Wt 370.4 lb

## 2018-05-28 DIAGNOSIS — I10 Essential (primary) hypertension: Secondary | ICD-10-CM | POA: Insufficient documentation

## 2018-05-28 DIAGNOSIS — M79605 Pain in left leg: Secondary | ICD-10-CM

## 2018-05-28 DIAGNOSIS — R42 Dizziness and giddiness: Secondary | ICD-10-CM

## 2018-05-28 DIAGNOSIS — R002 Palpitations: Secondary | ICD-10-CM

## 2018-05-28 DIAGNOSIS — R0789 Other chest pain: Secondary | ICD-10-CM | POA: Insufficient documentation

## 2018-05-28 DIAGNOSIS — R079 Chest pain, unspecified: Secondary | ICD-10-CM

## 2018-05-28 DIAGNOSIS — M79662 Pain in left lower leg: Secondary | ICD-10-CM | POA: Diagnosis not present

## 2018-05-28 DIAGNOSIS — R5383 Other fatigue: Secondary | ICD-10-CM

## 2018-05-28 DIAGNOSIS — M940 Chondrocostal junction syndrome [Tietze]: Secondary | ICD-10-CM

## 2018-05-28 DIAGNOSIS — N921 Excessive and frequent menstruation with irregular cycle: Secondary | ICD-10-CM

## 2018-05-28 DIAGNOSIS — R0602 Shortness of breath: Secondary | ICD-10-CM | POA: Diagnosis not present

## 2018-05-28 MED ORDER — METOPROLOL SUCCINATE ER 25 MG PO TB24: 25.0000 mg | ORAL_TABLET | Freq: Every day | ORAL | 0 refills | Status: DC

## 2018-05-28 MED ORDER — LORAZEPAM 0.5 MG PO TABS: 0.5000 mg | ORAL_TABLET | Freq: Two times a day (BID) | ORAL | 0 refills | Status: DC | PRN

## 2018-05-28 NOTE — Progress Notes (Signed)
Subjective:    Patient ID: Hannah Figueroa, female    DOB: [birth year], 56 y.o.   MRN: [id number]  HPI  Pt in with recent  atypical chest discomfort at time with palpitations that have been going on for a week or two. Pt states this is happening about 6 times a day.   She does explain at times she describes just palpitations and at other times more constant discomfort. Today she reports only transient 1-2 second area discomfort. No pain on deep inspiration.   Pt has been getting dizzy spells every day. Pt does note 2 years of constant menstruation. Describes menometrragia. More than 5 yrs since last saw gyn. Pt states dizziness not associated with transient palpitation or atypical chest pain. Pt bp readings at home with new wrist bp cuff are all very good over past week.(120-130/70-90). But one reading 160/90. No current gross motor or sensory function deficits.   Pt in the past had small area of pain left calf. In the past I  ordered US and she never got that done. No worse pain than in the past per her report.   Pt has been taking advil recentlly. Advised to stop.   Review of Systems  Constitutional: Positive for fatigue. Negative for chills and fever.  Respiratory: Positive for shortness of breath. Negative for cough, chest tightness and wheezing.        Mild occasional sob on activity. But she attributes to weight.  Cardiovascular: Positive for palpitations. Negative for chest pain.       Atypical chest pain. More palpitation. But chest pain costochondarl junction laying on rt side.  Gastrointestinal: Negative for abdominal pain.  Musculoskeletal: Negative for back pain.       Left calf pain.   Neurological: Positive for dizziness.       Slight dizzy but not now.  Psychiatric/Behavioral: Negative for behavioral problems and confusion. The patient is not hyperactive.     Past Medical History:  Diagnosis Date  . Anemia   . Diabetes mellitus without complication [facility])        Social History   Socioeconomic History  . Marital status: Married    Spouse name: Not on file  . Number of children: Not on file  . Years of education: Not on file  . Highest education level: Not on file  Occupational History  . Occupation: Orthoptist  Social Needs  . Financial resource strain: Not on file  . Food insecurity:    Worry: Not on file    Inability: Not on file  . Transportation needs:    Medical: Not on file    Non-medical: Not on file  Tobacco Use  . Smoking status: Former Smoker    Years: 20.00  . Smokeless tobacco: Never Used  Substance and Sexual Activity  . Alcohol use: No    Comment: soc  . Drug use: No  . Sexual activity: Yes  Lifestyle  . Physical activity:    Days per week: Not on file    Minutes per session: Not on file  . Stress: Not on file  Relationships  . Social connections:    Talks on phone: Not on file    Gets together: Not on file    Attends religious service: Not on file    Active member of club or organization: Not on file    Attends meetings of clubs or organizations: Not on file    Relationship status: Not on file  . Intimate  partner violence:    Fear of current or ex partner: Not on file    Emotionally abused: Not on file    Physically abused: Not on file    Forced sexual activity: Not on file  Other Topics Concern  . Not on file  Social History Narrative  . Not on file    Past Surgical History:  Procedure Laterality Date  . CESAREAN SECTION    . CHOLECYSTECTOMY      No family history on file.  Allergies  Allergen Reactions  . Meloxicam   . Penicillins     Current Outpatient Medications on File Prior to Visit  Medication Sig Dispense Refill  . LORazepam (ATIVAN) 0.5 MG tablet Take 1 tablet (0.5 mg total) by mouth 2 (two) times daily as needed for anxiety. 5 tablet 0   No current facility-administered medications on file prior to visit.     BP (!) 162/90   Pulse 74   Temp 98.3 F  (36.8 C) (Oral)   Resp 14   Ht 5\' 5"  (1.651 m)   Wt (!) 370 lb 6.4 oz (168 kg)   SpO2 100%   BMI 61.64 kg/m       Objective:   Physical Exam  General Mental Status- Alert. General Appearance- Not in acute distress.   Skin General: Color- Normal Color. Moisture- Normal Moisture.  Neck Carotid Arteries- Normal color. Moisture- Normal Moisture. No carotid bruits. No JVD.  Chest and Lung Exam Auscultation: Breath Sounds:-Normal.  Cardiovascular Auscultation:Rythm- Regular. Murmurs & Other Heart Sounds:Auscultation of the heart reveals- No Murmurs.  Anterior thorax- rt costochondral junction tenderness direct on palpation. Very tender to moderate touch.   Abdomen Inspection:-Inspeection Normal. Palpation/Percussion:Note:No mass. Palpation and Percussion of the abdomen reveal- Non Tender, Non Distended + BS, no rebound or guarding.    Neurologic Cranial Nerve exam:- CN III-XII intact(No nystagmus), symmetric smile. Strength:- 5/5 equal and symmetric strength both upper and lower extremities.  Lower ext- no pedal edema. But left anterior calf mild tender to palpation.      Assessment & Plan:  [phone].  You have various complaints and symptoms today.  You you have some atypical chest pain with recent palpitations and on exam some features of costochondritis.  Due to risk factors and as we approach weekend, I do think it is best that we do stat troponin, d-dimer, left lower extremity ultrasound and chest x-ray.  You can take Tylenol over the weekend for pain but avoid any NSAIDs as that can increase your blood pressure.  If you get any constant palpitation type sensation then recommend ED evaluation.  Also if you get any constant chest pain apart from when you lie on your right side then recommend ED evaluation as well.  We need to exercise caution as we approach we can then better to be safe and get checked if symptoms change as described.  ekg today showed  nsr.  Your ultrasound is scheduled for 6 PM tonight. Go ahead and get chest x-ray now.  Your blood pressure is moderately elevated but you report recent very good blood pressures at home when you check with your electronic cuff.  With blood pressure being elevated today, slight dizziness and reported palpitations, I decided to give you low-dose metoprolol.   Regarding the dizziness, he had good neurologic exam findings presently.  If you have any gross motor or sensory function deficits as described then recommend ED evaluation for CT scan of head.  You do have history of  heavy constant vaginal bleeding.  You also mention chronic low-level dizziness.  We will get labs today to assess if you are anemic and check your iron level.  In the near future I think it is a good idea to refer you to gynecologist for evaluation.  You may have uterine fibroids.  Follow-up this coming Tuesday or as needed.  We will contact you with lab results.  Please remember if troponin or d-dimer elevated then would need ED evaluation.  Esperanza Richters, PA-C   40 minutes spent with pt today. 50% of time spent counseling pt on work up for her chest pain, dizziness, palpitation and leg pain. Order ekg and coordinated work up/imaging studies. Counseld pt on lab result after hours as well.

## 2018-05-28 NOTE — Patient Instructions (Addendum)
You have various complaints and symptoms today.  You you have some atypical chest pain with recent palpitations and on exam some features of costochondritis.  Due to risk factors and as we approach weekend, I do think it is best that we do stat troponin, d-dimer, left lower extremity ultrasound and chest x-ray.  You can take Tylenol over the weekend for pain but avoid any NSAIDs as that can increase your blood pressure.  If you get any constant palpitation type sensation then recommend ED evaluation.  Also if you get any constant chest pain apart from when you lie on your right side then recommend ED evaluation as well.  We need to exercise caution as we approach we can then better to be safe and get checked if symptoms change as described.  ekg today showed nsr.  Your ultrasound is scheduled for 6 PM tonight. Go ahead and get chest x-ray now.  Your blood pressure is moderately elevated but you report recent very good blood pressures at home when you check with your electronic cuff.  With blood pressure being elevated today, slight dizziness and reported palpitations, I decided to give you low-dose metoprolol.   Regarding the dizziness, he had good neurologic exam findings presently.  If you have any gross motor or sensory function deficits as described then recommend ED evaluation for CT scan of head.  You do have history of heavy constant vaginal bleeding.  You also mention chronic low-level dizziness.  We will get labs today to assess if you are anemic and check your iron level.  In the near future I think it is a good idea to refer you to gynecologist for evaluation.  You may have uterine fibroids.  Follow-up this coming Tuesday or as needed.  We will contact you with lab results.  Please remember if troponin or d-dimer elevated then would need ED evaluation.

## 2018-05-28 NOTE — Addendum Note (Signed)
Addended by: Gwenevere Abbot on: 05/28/2018 09:49 PM   Modules accepted: Orders

## 2018-05-29 ENCOUNTER — Ambulatory Visit (HOSPITAL_BASED_OUTPATIENT_CLINIC_OR_DEPARTMENT_OTHER): Payer: 59

## 2018-05-29 LAB — COMPREHENSIVE METABOLIC PANEL
AG Ratio: 1 (calc) (ref 1.0–2.5)
ALBUMIN MSPROF: 3.7 g/dL (ref 3.6–5.1)
ALT: 7 U/L (ref 6–29)
AST: 8 U/L — ABNORMAL LOW (ref 10–35)
Alkaline phosphatase (APISO): 87 U/L (ref 33–130)
BILIRUBIN TOTAL: 0.2 mg/dL (ref 0.2–1.2)
BUN: 13 mg/dL (ref 7–25)
CHLORIDE: 104 mmol/L (ref 98–110)
CO2: 25 mmol/L (ref 20–32)
CREATININE: 0.75 mg/dL (ref 0.50–1.05)
Calcium: 9.2 mg/dL (ref 8.6–10.4)
GLOBULIN: 3.7 g/dL (ref 1.9–3.7)
Glucose, Bld: 98 mg/dL (ref 65–99)
POTASSIUM: 4.4 mmol/L (ref 3.5–5.3)
SODIUM: 141 mmol/L (ref 135–146)
TOTAL PROTEIN: 7.4 g/dL (ref 6.1–8.1)

## 2018-05-29 LAB — D-DIMER, QUANTITATIVE: D-Dimer, Quant: 0.32 mcg/mL FEU (ref <0.50)

## 2018-05-29 LAB — CBC WITH DIFFERENTIAL/PLATELET
BASOS ABS: 51 {cells}/uL (ref 0–200)
Basophils Relative: 0.4 %
EOS ABS: 495 {cells}/uL (ref 15–500)
Eosinophils Relative: 3.9 %
HEMATOCRIT: 38.5 % (ref 35.0–45.0)
Hemoglobin: 11.2 g/dL — ABNORMAL LOW (ref 11.7–15.5)
Lymphs Abs: 3912 cells/uL — ABNORMAL HIGH (ref 850–3900)
MCH: 19.5 pg — ABNORMAL LOW (ref 27.0–33.0)
MCHC: 29.1 g/dL — ABNORMAL LOW (ref 32.0–36.0)
MCV: 67 fL — AB (ref 80.0–100.0)
MPV: 11.2 fL (ref 7.5–12.5)
Monocytes Relative: 5.8 %
Neutro Abs: 7506 cells/uL (ref 1500–7800)
Neutrophils Relative %: 59.1 %
PLATELETS: 380 10*3/uL (ref 140–400)
RBC: 5.75 10*6/uL — AB (ref 3.80–5.10)
RDW: 17.6 % — AB (ref 11.0–15.0)
TOTAL LYMPHOCYTE: 30.8 %
WBC mixed population: 737 cells/uL (ref 200–950)
WBC: 12.7 10*3/uL — ABNORMAL HIGH (ref 3.8–10.8)

## 2018-05-29 LAB — TROPONIN I: Troponin I: <0.01 ng/mL (ref <=0.0)

## 2018-05-29 LAB — IRON,TIBC AND FERRITIN PANEL
%SAT: 4 % — AB (ref 16–45)
FERRITIN: 13 ng/mL — AB (ref 16–232)
IRON: 13 ug/dL — AB (ref 45–160)
TIBC: 363 mcg/dL (calc) (ref 250–450)

## 2018-05-29 LAB — CBC MORPHOLOGY

## 2018-06-07 ENCOUNTER — Telehealth: Payer: Self-pay | Admitting: Medical

## 2018-06-07 NOTE — Telephone Encounter (Signed)
Copied from CRM [id number]. Topic: Quick Communication - See Telephone Encounter >> Jun 07, 2018  3:35 PM Luanna Cole wrote: CRM for notification. See Telephone encounter for: 06/07/18. Pt called and stated that she is having headaches and nausea everyday from  metoprolol succinate (TOPROL-XL) 25 MG 24 hr tablet [[id number]. Please advise

## 2018-06-08 NOTE — Telephone Encounter (Signed)
Patient needs neurologic exam, bp check and office visit. Metprolol may be causing these symptoms and if she is convinced that is the case can stop but needs appointment within 24 hours. So please get her scheduled.  If severe symptoms may even need to go to ED as her symptoms can be from various dx not necessary medication side effect.

## 2018-06-10 ENCOUNTER — Ambulatory Visit: Payer: 59 | Admitting: Medical

## 2018-06-10 DIAGNOSIS — Z0289 Encounter for other administrative examinations: Secondary | ICD-10-CM

## 2018-06-10 NOTE — Telephone Encounter (Signed)
Pt. Has appointment today 10/24.

## 2018-06-17 ENCOUNTER — Encounter: Payer: Self-pay | Admitting: Medical

## 2019-03-06 IMAGING — CR DG CHEST 2V
2 series · 2 of 2 positions shown · non-contrast
Comparison: Chest x-ray dated 08/26/2009.

CLINICAL DATA: Chest pain for 2 weeks with shortness of breath.
Ex-smoker.

EXAM:
CHEST - 2 VIEW

[w chest pa *]
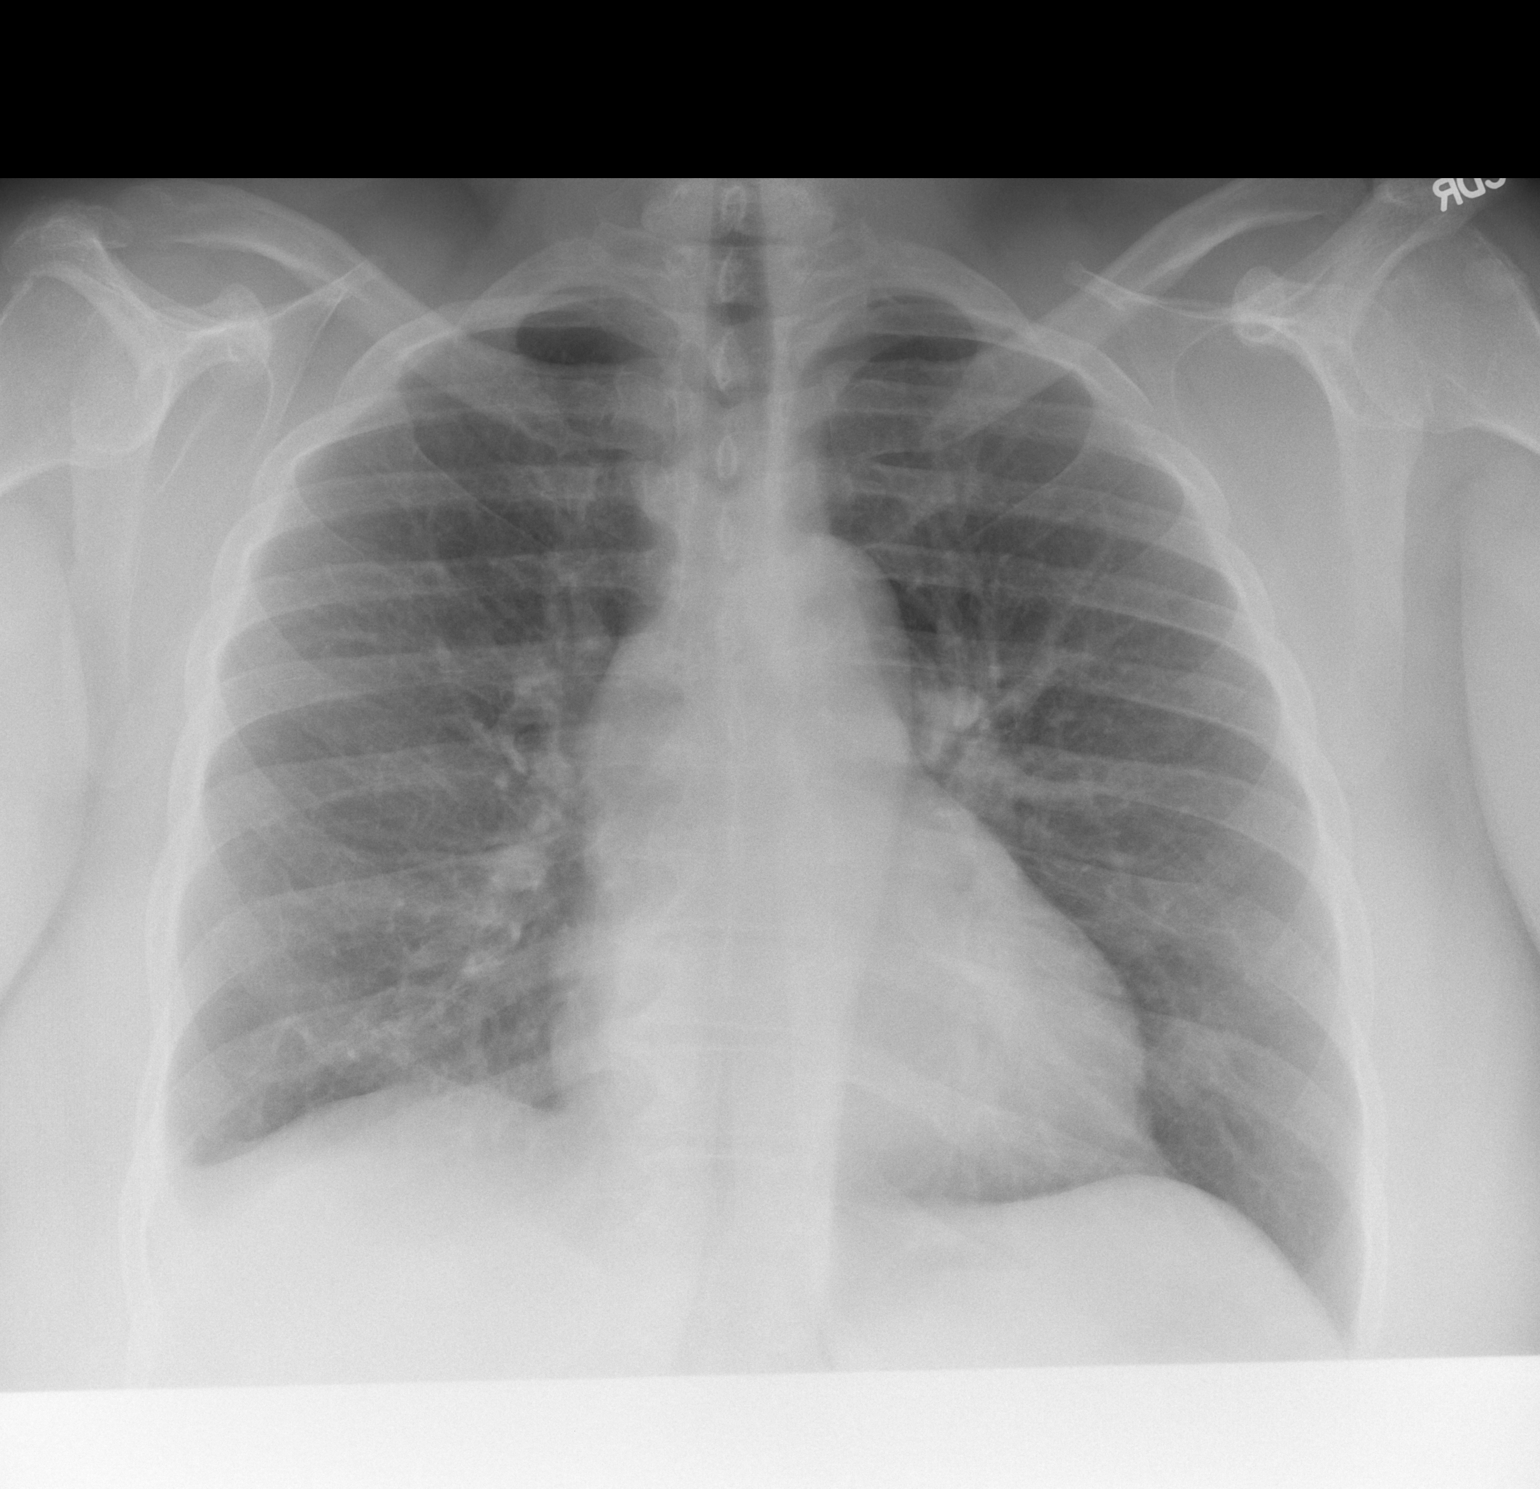

[w chest lat *]
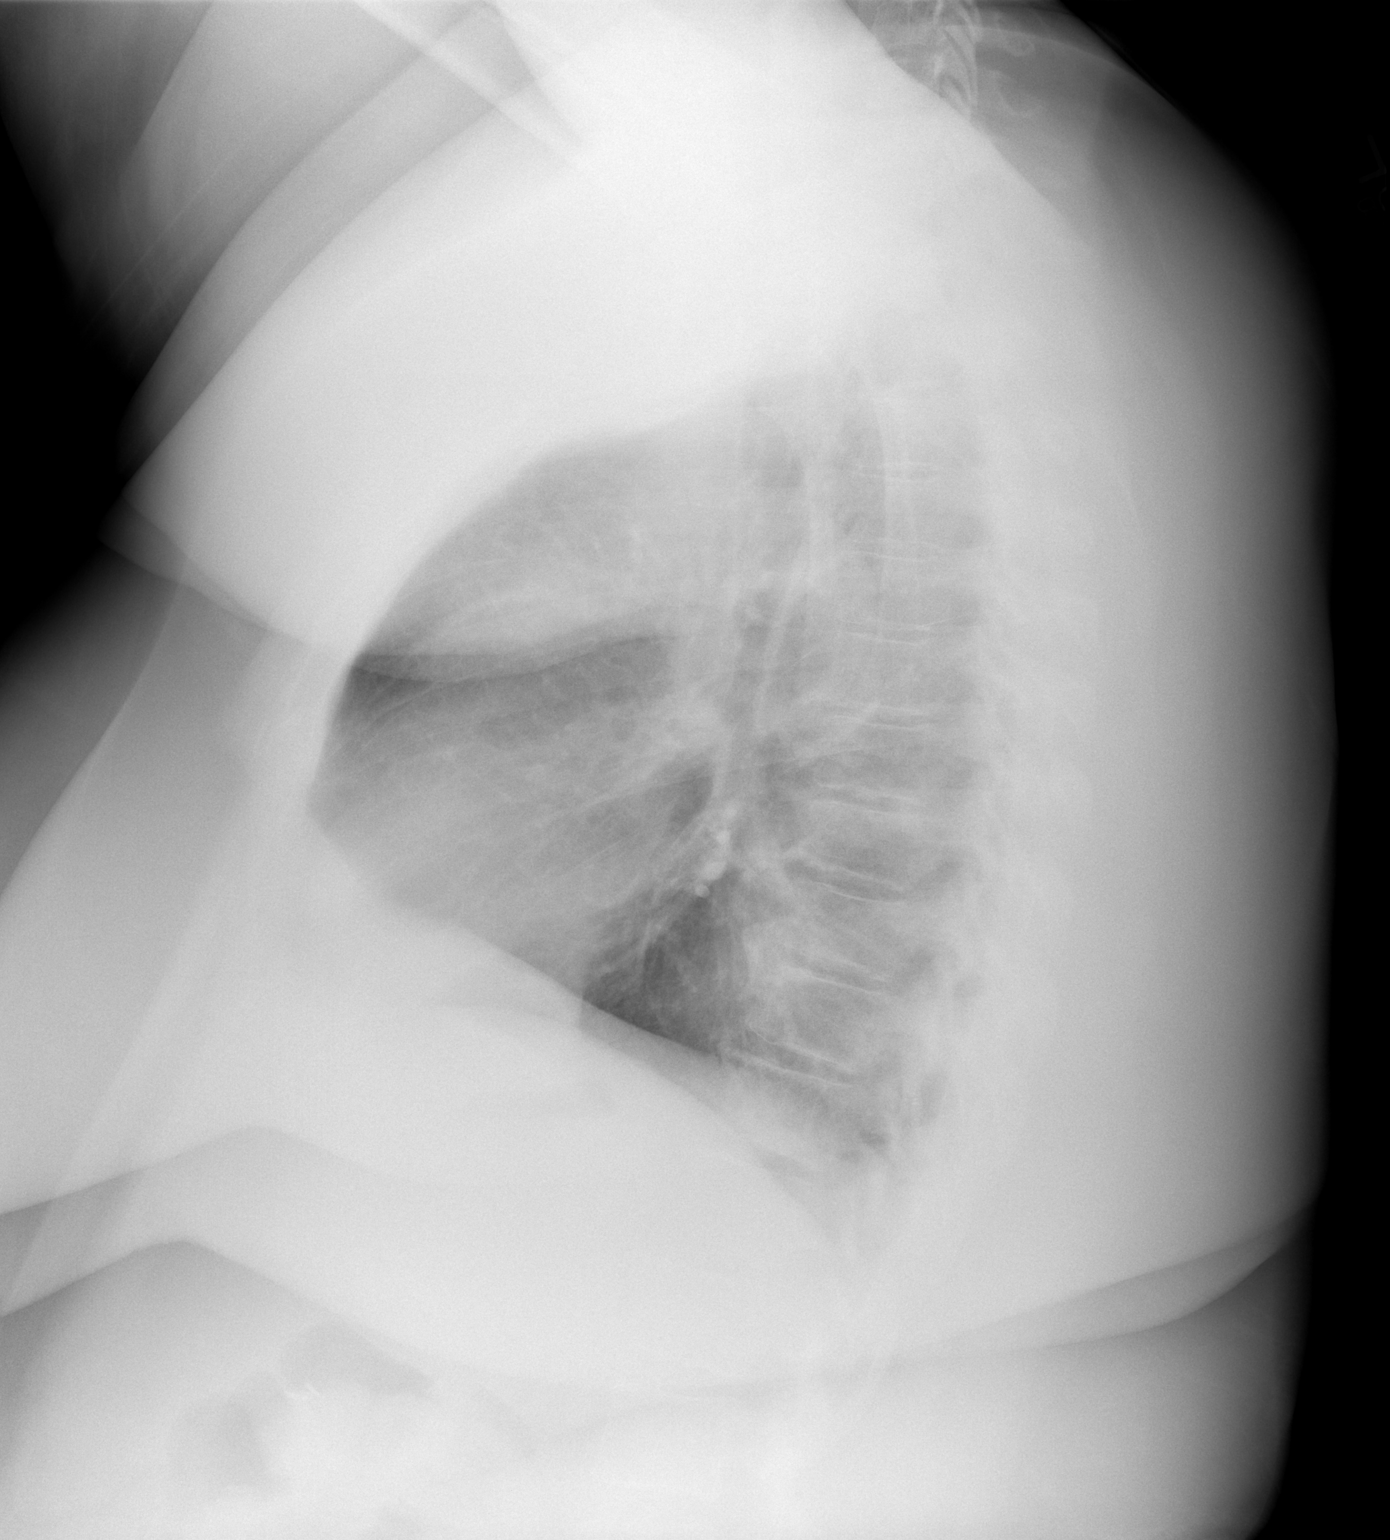

[2 of 2 positions shown; findings below may reference images not displayed]

FINDINGS: Heart size and mediastinal contours are within normal limits. Lungs
are clear. No pleural effusion or pneumothorax seen. Osseous
structures about the chest are unremarkable.
IMPRESSION: No active cardiopulmonary disease. No evidence of pneumonia or
pulmonary edema.

## 2019-05-04 ENCOUNTER — Ambulatory Visit (INDEPENDENT_AMBULATORY_CARE_PROVIDER_SITE_OTHER): Payer: 59 | Admitting: Medical

## 2019-05-04 ENCOUNTER — Encounter: Payer: Self-pay | Admitting: Medical

## 2019-05-04 ENCOUNTER — Other Ambulatory Visit: Payer: Self-pay

## 2019-05-04 VITALS — BP 111/52 | Temp 97.8°F

## 2019-05-04 DIAGNOSIS — R1011 Right upper quadrant pain: Secondary | ICD-10-CM | POA: Diagnosis not present

## 2019-05-04 MED ORDER — FAMOTIDINE 20 MG PO TABS: 20.0000 mg | ORAL_TABLET | Freq: Two times a day (BID) | ORAL | 0 refills | Status: DC

## 2019-05-04 NOTE — Progress Notes (Signed)
   Subjective:    Patient ID: Hannah Figueroa, female    DOB: [birth year], 58 y.o.   MRN: [id number]  HPI Virtual Visit via Video Note  I connected with Hannah Figueroa on 05/04/19 at  2:40 PM EDT by a video enabled telemedicine application and verified that I am speaking with the correct person using two identifiers.  Location: Patient: home Provider: office.   I discussed the limitations of evaluation and management by telemedicine and the availability of in person appointments. The patient expressed understanding and agreed to proceed.  History of Present Illness:   Pt has some rt side abdomen pain which is worse after eating. States uncomfortable and gasy for about one year. But last month has pain rt side. Some ruq region pain daily after eating. Last bowel movement was yesterday was loose. No constipation preceding. Some daily mild fatigue. She does eat a lot of ice.  Pt states her blood pressure was getting better even without metoprolol. bp ususally less than 140/90.  Gallbladder removed late 90's.    Observations/Objective:  General-no acute distress, pleasant, oriented. Lungs- on inspection lungs appear unlabored. Neck- no tracheal deviation or jvd on inspection. Neuro- gross motor function appears intact. Abdomen- faint rt upper quadrant tenderness on palpation.   Assessment and Plan: You had recent ruq pain past month with discomfort and gassy sensation for one year. Will put in future labs to get done on Friday as well as ruq [place].   Labs to include cmp, cbc, lipase and h pylori breath test. US abdomen will show if fatty liver among other potential finding. Retained common bile duct stone possible but unliklely.  If symptoms change or worsen notify us. If severe then ED evaluation.  Eat healthy as described start famotadine on Friday after breath test.  Follow up 10 days or as needed  Follow Up Instructions:    I discussed the assessment and treatment plan with the  patient. The patient was provided an opportunity to ask questions and all were answered. The patient agreed with the plan and demonstrated an understanding of the instructions.   The patient was advised to call back or seek an in-person evaluation if the symptoms worsen or if the condition fails to improve as anticipated.  I provided 25  minutes of non-face-to-face time during this encounter.   Mackie Pai, PA-C    Review of Systems  Constitutional: Negative for chills, fatigue and fever.  HENT: Negative for congestion, sore throat and tinnitus.        No change in smell.   Respiratory: Negative for cough, chest tightness, shortness of breath and wheezing.   Cardiovascular: Negative for chest pain and palpitations.  Gastrointestinal: Positive for abdominal pain and diarrhea. Negative for blood in stool, constipation, nausea and vomiting.       States loose stools for 1-2 weeks. That is getting less/tapering off now.  Musculoskeletal: Negative for back pain.  Skin: Negative for rash.  Neurological: Negative for dizziness, speech difficulty, numbness and headaches.  Psychiatric/Behavioral: Negative for behavioral problems. The patient is not nervous/anxious.        Objective:   Physical Exam        Assessment & Plan:

## 2019-05-04 NOTE — Patient Instructions (Addendum)
You had recent ruq pain past month with discomfort and gassy sensation for one year. Will put in future labs to get done on Friday as well as ruq [place].   Labs to include cmp, cbc, lipase and h pylori breath test. US abdomen will show if fatty liver among other potential finding. Retained common bile duct stone possible but unliklely.  If symptoms change or worsen notify us. If severe then ED evaluation.  Eat healthy as described start famotadine on Friday after breath test.  Follow up 10 days or as needed

## 2019-05-05 ENCOUNTER — Encounter: Payer: Self-pay | Admitting: Medical

## 2019-05-05 ENCOUNTER — Other Ambulatory Visit (INDEPENDENT_AMBULATORY_CARE_PROVIDER_SITE_OTHER): Payer: 59

## 2019-05-05 DIAGNOSIS — R1011 Right upper quadrant pain: Secondary | ICD-10-CM | POA: Diagnosis not present

## 2019-05-05 LAB — CBC WITH DIFFERENTIAL/PLATELET
Basophils Absolute: 0.1 10*3/uL (ref 0.0–0.1)
Basophils Relative: 1 % (ref 0.0–3.0)
Eosinophils Absolute: 0.4 10*3/uL (ref 0.0–0.7)
Eosinophils Relative: 2.9 % (ref 0.0–5.0)
HCT: 31.7 % — ABNORMAL LOW (ref 36.0–46.0)
Hemoglobin: 9.2 g/dL — ABNORMAL LOW (ref 12.0–15.0)
Lymphocytes Relative: 22.1 % (ref 12.0–46.0)
Lymphs Abs: 2.8 10*3/uL (ref 0.7–4.0)
MCHC: 28.9 g/dL — ABNORMAL LOW (ref 30.0–36.0)
MCV: 59.6 fl — ABNORMAL LOW (ref 78.0–100.0)
Monocytes Absolute: 0.6 10*3/uL (ref 0.1–1.0)
Monocytes Relative: 4.6 % (ref 3.0–12.0)
Neutro Abs: 8.7 10*3/uL — ABNORMAL HIGH (ref 1.4–7.7)
Neutrophils Relative %: 69.4 % (ref 43.0–77.0)
Platelets: 386 10*3/uL (ref 150.0–400.0)
RBC: 5.32 Mil/uL — ABNORMAL HIGH (ref 3.87–5.11)
RDW: 21.1 % — ABNORMAL HIGH (ref 11.5–15.5)
WBC: 12.6 10*3/uL — ABNORMAL HIGH (ref 4.0–10.5)

## 2019-05-05 LAB — COMPREHENSIVE METABOLIC PANEL
ALT: 7 U/L (ref 0–35)
AST: 9 U/L (ref 0–37)
Albumin: 3.5 g/dL (ref 3.5–5.2)
Alkaline Phosphatase: 92 U/L (ref 39–117)
BUN: 11 mg/dL (ref 6–23)
CO2: 29 mEq/L (ref 19–32)
Calcium: 9.1 mg/dL (ref 8.4–10.5)
Chloride: 103 mEq/L (ref 96–112)
Creatinine, Ser: 0.73 mg/dL (ref 0.40–1.20)
GFR: 99.59 mL/min (ref >60.00)
Glucose, Bld: 213 mg/dL — ABNORMAL HIGH (ref 70–99)
Potassium: 4.4 mEq/L (ref 3.5–5.1)
Sodium: 138 mEq/L (ref 135–145)
Total Bilirubin: 0.3 mg/dL (ref 0.2–1.2)
Total Protein: 7.3 g/dL (ref 6.0–8.3)

## 2019-05-05 LAB — LIPASE: Lipase: 8 U/L — ABNORMAL LOW (ref 11.0–59.0)

## 2019-05-06 ENCOUNTER — Ambulatory Visit (HOSPITAL_BASED_OUTPATIENT_CLINIC_OR_DEPARTMENT_OTHER)
Admission: RE | Admit: 2019-05-06 | Discharge: 2019-05-06 | Disposition: A | Discharge status: Home / Self Care | Payer: 59 | Source: Ambulatory Visit | Attending: Medical | Admitting: Medical

## 2019-05-06 ENCOUNTER — Other Ambulatory Visit: Payer: Self-pay

## 2019-05-06 DIAGNOSIS — R1011 Right upper quadrant pain: Secondary | ICD-10-CM | POA: Insufficient documentation

## 2019-05-06 LAB — H. PYLORI BREATH TEST: H. pylori Breath Test: NOT DETECTED

## 2019-05-09 ENCOUNTER — Ambulatory Visit: Payer: 59 | Admitting: Medical

## 2019-05-11 ENCOUNTER — Ambulatory Visit: Payer: 59 | Admitting: Medical

## 2019-05-12 ENCOUNTER — Other Ambulatory Visit: Payer: Self-pay

## 2019-05-13 ENCOUNTER — Other Ambulatory Visit: Payer: Self-pay

## 2019-05-13 ENCOUNTER — Ambulatory Visit: Payer: 59 | Admitting: Medical

## 2019-05-13 ENCOUNTER — Encounter: Payer: Self-pay | Admitting: Medical

## 2019-05-13 VITALS — BP 127/72 | HR 87 | Temp 96.7°F | Resp 16 | Ht 65.0 in | Wt 390.2 lb

## 2019-05-13 DIAGNOSIS — N921 Excessive and frequent menstruation with irregular cycle: Secondary | ICD-10-CM

## 2019-05-13 DIAGNOSIS — K760 Fatty (change of) liver, not elsewhere classified: Secondary | ICD-10-CM

## 2019-05-13 DIAGNOSIS — R1011 Right upper quadrant pain: Secondary | ICD-10-CM

## 2019-05-13 DIAGNOSIS — D649 Anemia, unspecified: Secondary | ICD-10-CM | POA: Diagnosis not present

## 2019-05-13 DIAGNOSIS — R739 Hyperglycemia, unspecified: Secondary | ICD-10-CM

## 2019-05-13 LAB — CBC WITH DIFFERENTIAL/PLATELET
Basophils Absolute: 0.1 10*3/uL (ref 0.0–0.1)
Basophils Relative: 0.5 % (ref 0.0–3.0)
Eosinophils Absolute: 0.3 10*3/uL (ref 0.0–0.7)
Eosinophils Relative: 3.1 % (ref 0.0–5.0)
HCT: 32.4 % — ABNORMAL LOW (ref 36.0–46.0)
Hemoglobin: 9.2 g/dL — ABNORMAL LOW (ref 12.0–15.0)
Lymphocytes Relative: 27.6 % (ref 12.0–46.0)
Lymphs Abs: 3.1 10*3/uL (ref 0.7–4.0)
MCHC: 28.4 g/dL — ABNORMAL LOW (ref 30.0–36.0)
MCV: 59.7 fl — ABNORMAL LOW (ref 78.0–100.0)
Monocytes Absolute: 0.6 10*3/uL (ref 0.1–1.0)
Monocytes Relative: 5.3 % (ref 3.0–12.0)
Neutro Abs: 7.2 10*3/uL (ref 1.4–7.7)
Neutrophils Relative %: 63.5 % (ref 43.0–77.0)
Platelets: 399 10*3/uL (ref 150.0–400.0)
RBC: 5.42 Mil/uL — ABNORMAL HIGH (ref 3.87–5.11)
RDW: 21.1 % — ABNORMAL HIGH (ref 11.5–15.5)
WBC: 11.3 10*3/uL — ABNORMAL HIGH (ref 4.0–10.5)

## 2019-05-13 LAB — HEMOGLOBIN A1C: Hgb A1c MFr Bld: 9 % — ABNORMAL HIGH (ref 4.6–6.5)

## 2019-05-13 LAB — IRON: Iron: <10 ug/dL — ABNORMAL LOW (ref 42–145)

## 2019-05-13 MED ORDER — MEDROXYPROGESTERONE ACETATE 10 MG PO TABS: 10.0000 mg | ORAL_TABLET | Freq: Every day | ORAL | 0 refills | Status: DC

## 2019-05-13 NOTE — Patient Instructions (Addendum)
For your recent abdomen pain, I do want you to try the famotidine and update me if this helps with your stomach discomfort.  Also we discussed that fatty liver can sometimes cause discomfort as well.  For your anemia, I am going to get a CBC to assess if your levels have dropped.  Also will get iron level.  In addition I want you to mail back stool cards for blood.  Your sugars have been elevated recently.  We will get an A1c level to check your three-month blood sugar average.  Then decide on what level of medication you may need.  For heavy irregular menstrual bleeding, I went ahead and prescribe Provera tablets.  Hopefully this will decrease your bleeding.  Also I am going to go ahead and refer you to gynecologist.  Very important that you attend that appointment.  If you do not get a call from specialist office by Tuesday then please update me.  Your blood pressure is elevated today but you do report some lower blood pressure readings at home.  I want you to check your blood pressure daily and update me on Tuesday as well regarding your blood pressure levels.  Follow-up date to be determined after lab review

## 2019-05-13 NOTE — Progress Notes (Signed)
Subjective:    Patient ID: Hannah Figueroa, female    DOB: 07/02/1961, 58 y.o.   MRN: [id number]  HPI  Pt in for follow up.  Pt had some moderate to severe anemia on recent labs. Hx of chronic vaginal bleeding. I had wanted to see gyn in the past for this. She never went to the appointment. I placed referral 2 years ago.   Pt had some abdomen pain for which I rx'd famotadine. She has not taken the tablet yet.  [place] of liver showed fat accumalation.  I had wanted pt back today to check her cbc iron and for there to pick up stool cards for blood.  Also will get a1c since sugar was high on last lab.  Pt bp at home other day was 130/?   Review of Systems  Constitutional: Negative for chills, fatigue and fever.  Respiratory: Negative for cough, chest tightness, shortness of breath, wheezing and stridor.   Cardiovascular: Negative for chest pain and palpitations.  Gastrointestinal: Positive for abdominal pain. Negative for diarrhea, nausea and vomiting.       See hpi.  Musculoskeletal: Negative for back pain.  Skin: Negative for rash.  Neurological: Negative for dizziness, seizures, weakness and headaches.  Hematological: Negative for adenopathy. Does not bruise/bleed easily.  Psychiatric/Behavioral: Negative for behavioral problems and dysphoric mood.    Past Medical History:  Diagnosis Date  . Anemia   . Diabetes mellitus without complication [facility])      Social History   Socioeconomic History  . Marital status: Married    Spouse name: Not on file  . Number of children: Not on file  . Years of education: Not on file  . Highest education level: Not on file  Occupational History  . Occupation: Orthoptist  Social Needs  . Financial resource strain: Not on file  . Food insecurity    Worry: Not on file    Inability: Not on file  . Transportation needs    Medical: Not on file    Non-medical: Not on file  Tobacco Use  . Smoking status: Former  Smoker    Years: 20.00  . Smokeless tobacco: Never Used  Substance and Sexual Activity  . Alcohol use: No    Comment: soc  . Drug use: No  . Sexual activity: Yes  Lifestyle  . Physical activity    Days per week: Not on file    Minutes per session: Not on file  . Stress: Not on file  Relationships  . Social Musician on phone: Not on file    Gets together: Not on file    Attends religious service: Not on file    Active member of club or organization: Not on file    Attends meetings of clubs or organizations: Not on file    Relationship status: Not on file  . Intimate partner violence    Fear of current or ex partner: Not on file    Emotionally abused: Not on file    Physically abused: Not on file    Forced sexual activity: Not on file  Other Topics Concern  . Not on file  Social History Narrative  . Not on file    Past Surgical History:  Procedure Laterality Date  . CESAREAN SECTION    . CHOLECYSTECTOMY      No family history on file.  Allergies  Allergen Reactions  . Meloxicam   . Penicillins  Current Outpatient Medications on File Prior to Visit  Medication Sig Dispense Refill  . famotidine (PEPCID) 20 MG tablet Take 1 tablet (20 mg total) by mouth 2 (two) times daily. (Patient not taking: Reported on 05/13/2019) 60 tablet 0  . LORazepam (ATIVAN) 0.5 MG tablet Take 1 tablet (0.5 mg total) by mouth 2 (two) times daily as needed for anxiety. (Patient not taking: Reported on 05/04/2019) 14 tablet 0  . metoprolol succinate (TOPROL-XL) 25 MG 24 hr tablet Take 1 tablet (25 mg total) by mouth daily. (Patient not taking: Reported on 05/04/2019) 30 tablet 0   No current facility-administered medications on file prior to visit.     BP (!) 150/80 Comment: checked twice. espac.  Pulse 87   Temp (!) 96.7 F (35.9 C) (Temporal)   Resp 16   Ht 5\' 5"  (1.651 m)   Wt (!) 390 lb 3.2 oz (177 kg)   SpO2 100%   BMI 64.93 kg/m       Objective:   Physical Exam   General Mental Status- Alert. General Appearance- Not in acute distress.   Skin General: Color- Normal Color. Moisture- Normal Moisture.  Neck Carotid Arteries- Normal color. Moisture- Normal Moisture. No carotid bruits. No JVD.  Chest and Lung Exam Auscultation: Breath Sounds:-Normal.  Cardiovascular Auscultation:Rythm- Regular. Murmurs & Other Heart Sounds:Auscultation of the heart reveals- No Murmurs.  Abdomen Inspection:-Inspeection Normal. Palpation/Percussion:Note:No mass. Palpation and Percussion of the abdomen reveal- Non Tender, Non Distended + BS, no rebound or guarding.   Neurologic Cranial Nerve exam:- CN III-XII intact(No nystagmus), symmetric smile. Strength:- 5/5 equal and symmetric strength both upper and lower extremities.      Assessment & Plan:  For your recent abdomen pain, I do want you to try the famotidine and update me if this helps with your stomach discomfort.  Also we discussed that fatty liver can sometimes cause discomfort as well.  For your anemia, I am going to get a CBC to assess if your levels have dropped.  Also will get iron level.  In addition I want you to mail back stool cards for blood.  Your sugars have been elevated recently.  We will get an A1c level to check your three-month blood sugar average.  Then decide on what level of medication you may need.  For heavy irregular menstrual bleeding, I went ahead and prescribe Provera tablets.  Hopefully this will decrease your bleeding.  Also I am going to go ahead and refer you to gynecologist.  Very important that you attend that appointment.  If you do not get a call from specialist office by Tuesday then please update me.  Your blood pressure is elevated today but you do report some lower blood pressure readings at home.  I want you to check your blood pressure daily and update me on Tuesday as well regarding your blood pressure levels.  Follow-up date to be determined after lab review   Mackie Pai, PA-C

## 2019-05-17 ENCOUNTER — Encounter: Payer: Self-pay | Admitting: Medical

## 2019-05-17 ENCOUNTER — Telehealth: Payer: Self-pay | Admitting: Medical

## 2019-05-17 NOTE — Telephone Encounter (Signed)
Will you look into pt gyn referral. Pt has not been called yet??

## 2019-05-18 NOTE — Telephone Encounter (Signed)
I have sent a msg to the referral coordinator for a status update.

## 2019-06-03 ENCOUNTER — Other Ambulatory Visit (HOSPITAL_COMMUNITY)
Admission: RE | Admit: 2019-06-03 | Discharge: 2019-06-03 | Disposition: A | Discharge status: Home / Self Care | Payer: 59 | Source: Ambulatory Visit | Attending: Obstetrics & Gynecology | Admitting: Obstetrics & Gynecology

## 2019-06-03 ENCOUNTER — Encounter: Payer: Self-pay | Admitting: Obstetrics & Gynecology

## 2019-06-03 ENCOUNTER — Ambulatory Visit (INDEPENDENT_AMBULATORY_CARE_PROVIDER_SITE_OTHER): Payer: 59 | Admitting: Obstetrics & Gynecology

## 2019-06-03 ENCOUNTER — Other Ambulatory Visit: Payer: Self-pay

## 2019-06-03 VITALS — Ht 65.0 in | Wt 394.0 lb

## 2019-06-03 DIAGNOSIS — Z1151 Encounter for screening for human papillomavirus (HPV): Secondary | ICD-10-CM

## 2019-06-03 DIAGNOSIS — N950 Postmenopausal bleeding: Secondary | ICD-10-CM | POA: Insufficient documentation

## 2019-06-03 DIAGNOSIS — N841 Polyp of cervix uteri: Secondary | ICD-10-CM

## 2019-06-03 DIAGNOSIS — Z01419 Encounter for gynecological examination (general) (routine) without abnormal findings: Secondary | ICD-10-CM | POA: Diagnosis present

## 2019-06-03 DIAGNOSIS — Z124 Encounter for screening for malignant neoplasm of cervix: Secondary | ICD-10-CM

## 2019-06-03 DIAGNOSIS — N939 Abnormal uterine and vaginal bleeding, unspecified: Secondary | ICD-10-CM

## 2019-06-03 NOTE — Progress Notes (Signed)
Subjective:     Ivania Teagarden is a 58 y.o. female here for a routine exam. [phone] Pt reports that she has been bleeding daily for almost 3 years. She reports that there are a few days a month when she does not bleed but, otherwise, she bleeds daily. She reports that the amount of bleeding varies between very light and very heavy. She denies any constitutional sx. She denies unexplained weight loss. She has never had 1 year without bleeding.   Gynecologic History No LMP recorded. (Menstrual status: Irregular Periods). Contraception: none Last Pap: unknown >5 years. No h/o abnormal PAP or STIs Screening mammogram: possible 2 years prev.   Obstetric History OB History  Gravida Para Term Preterm AB Living  4 3 3   1 3   SAB TAB Ectopic Multiple Live Births  1       3    # Outcome Date GA Lbr Len/2nd Weight Sex Delivery Anes PTL Lv  4 Term 12/25/84    F CS-LTranv   LIV  3 SAB 08/19/83          2 Term 01/21/83    M CS-LTranv   LIV  1 Term 04/26/81    F Vag-Spont   LIV   The following portions of the patient's history were reviewed and updated as appropriate: allergies, current medications, past family history, past medical history, past social history, past surgical history and problem list.  Review of Systems Pertinent items are noted in HPI.    Objective:  Ht 5\' 5"  (1.651 m)   Wt (!) 394 lb (178.7 kg)   BMI 65.57 kg/m  General Appearance:    Alert, cooperative, no distress, appears stated age  Head:    Normocephalic, without obvious abnormality, atraumatic  Eyes:    conjunctiva/corneas clear, EOM's intact, both eyes  Ears:    Normal external ear canals, both ears  Nose:   Nares normal, septum midline, mucosa normal, no drainage    or sinus tenderness  Throat:   Lips, mucosa, and tongue normal; teeth and gums normal  Neck:   Supple, symmetrical, trachea midline, no adenopathy;    thyroid:  no enlargement/tenderness/nodules  Back:     Symmetric, no curvature, ROM normal, no CVA  tenderness  Lungs:     respirations unlabored  Chest Wall:    No tenderness or deformity   Heart:    Regular rate and rhythm  Breast Exam:    No tenderness, masses, or nipple abnormality  Abdomen:     Soft, non-tender, bowel sounds active all four quadrants,    no masses, no organomegaly; large pannus noted.   Genitalia:    Normal female without lesion, discharge or tenderness   Bimanual not noted. Large polyp noted at cervical os. 3 cm in size.   Extremities:   Extremities normal, atraumatic, no cyanosis or edema  Pulses:   2+ and symmetric all extremities  Skin:   Skin color, texture, turgor normal, no rashes or lesions    GYN procedure:  The indications for endometrial biopsy were reviewed.   Risks of the biopsy including cramping, bleeding, infection, uterine perforation, inadequate specimen and need for additional procedures  were discussed. The patient states she understands and agrees to undergo procedure today. Consent was signed. Time out was performed. A sterile speculum was placed in the patient's vagina and the cervix was prepped with Betadine. A single-toothed tenaculum was placed on the anterior lip of the cervix to stabilize it. The 3 mm pipelle  was introduced into the endometrial cavity without difficulty to a depth of 13cm, and a moderate amount of tissue was obtained and sent to pathology. The instruments were removed from the patient's vagina. Minimal bleeding from the cervix was noted. The patient tolerated the procedure well. Routine post-procedure instructions were given to the patient. The patient will follow up to review the results and for further management.     Dynastee was seen today for new patient (initial visit).  Diagnoses and all orders for this visit:  Post-menopausal bleeding -     Surgical pathology( [place]/ [facility]) -     Cytology - PAP( [place]) -     US PELVIS TRANSVAGINAL NON-OB ([facility] ONLY); Future  Well female exam with routine gynecological  exam -     Surgical pathology( [place]/ [facility]) -     Cytology - PAP( [facility]) -     US PELVIS TRANSVAGINAL NON-OB (TV ONLY); Future  Cervical polyp  Obesity, morbid, BMI 50 or higher ([place])  f/u in 2 weeks to review results.  Megace 40mg  bid  Elanie Hammitt L. Harraway-Smith, M.D., Cherlynn June

## 2019-06-06 ENCOUNTER — Encounter: Payer: Self-pay | Admitting: Obstetrics & Gynecology

## 2019-06-06 ENCOUNTER — Telehealth: Payer: Self-pay

## 2019-06-06 LAB — SURGICAL PATHOLOGY

## 2019-06-06 MED ORDER — MEGESTROL ACETATE 40 MG PO TABS: 40.0000 mg | ORAL_TABLET | Freq: Two times a day (BID) | ORAL | 3 refills | Status: DC

## 2019-06-06 NOTE — Telephone Encounter (Signed)
-----   Message from Lavonia Drafts, MD sent at 06/06/2019 10:39 AM EDT ----- Please call pt. Her rx for Megace is at [facility].   Thx,  Clh-S

## 2019-06-08 LAB — CYTOLOGY - PAP
Comment: NEGATIVE
Diagnosis: NEGATIVE
High risk HPV: NEGATIVE

## 2019-06-14 ENCOUNTER — Other Ambulatory Visit: Payer: Self-pay

## 2019-06-14 DIAGNOSIS — N841 Polyp of cervix uteri: Secondary | ICD-10-CM

## 2019-06-14 DIAGNOSIS — N939 Abnormal uterine and vaginal bleeding, unspecified: Secondary | ICD-10-CM

## 2019-06-14 DIAGNOSIS — N950 Postmenopausal bleeding: Secondary | ICD-10-CM

## 2019-06-14 MED ORDER — MEGESTROL ACETATE 40 MG PO TABS: 40.0000 mg | ORAL_TABLET | Freq: Two times a day (BID) | ORAL | 3 refills | Status: DC

## 2019-06-17 ENCOUNTER — Ambulatory Visit (HOSPITAL_BASED_OUTPATIENT_CLINIC_OR_DEPARTMENT_OTHER): Payer: 59

## 2019-06-20 ENCOUNTER — Other Ambulatory Visit (INDEPENDENT_AMBULATORY_CARE_PROVIDER_SITE_OTHER): Payer: 59

## 2019-06-20 DIAGNOSIS — D649 Anemia, unspecified: Secondary | ICD-10-CM | POA: Diagnosis not present

## 2019-06-20 LAB — FECAL OCCULT BLOOD, IMMUNOCHEMICAL: Fecal Occult Bld: NEGATIVE

## 2019-06-20 NOTE — Addendum Note (Signed)
Addended by: Kelle Darting A on: 06/20/2019 09:31 AM   Modules accepted: Orders

## 2019-06-21 ENCOUNTER — Ambulatory Visit (HOSPITAL_BASED_OUTPATIENT_CLINIC_OR_DEPARTMENT_OTHER): Payer: 59

## 2019-06-22 ENCOUNTER — Ambulatory Visit: Payer: 59 | Admitting: Obstetrics & Gynecology

## 2019-12-18 IMAGING — US US EXTREM LOW VENOUS*L*
1 series · 13 of 24 positions shown · non-contrast
Comparison: None.

CLINICAL DATA: Patient with left calf pain.



[Series 1: us extrem low venous*left* · 0.09mm/px · 13 of 29 slices shown]
[im 1/29]
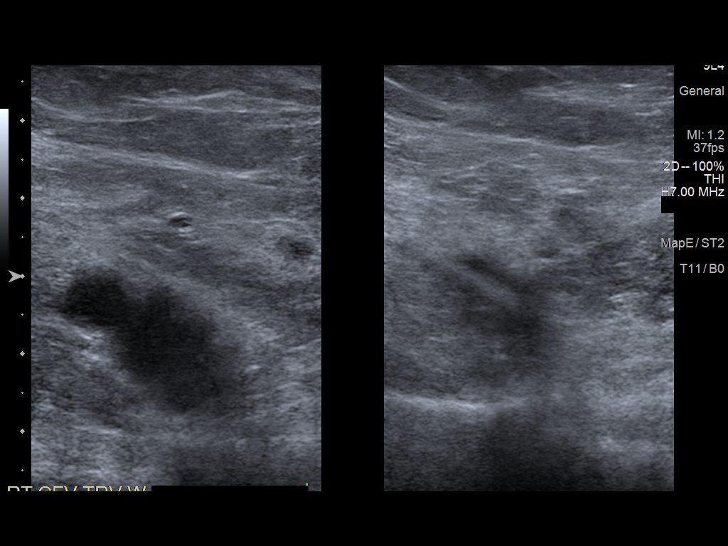
[im 3/29]
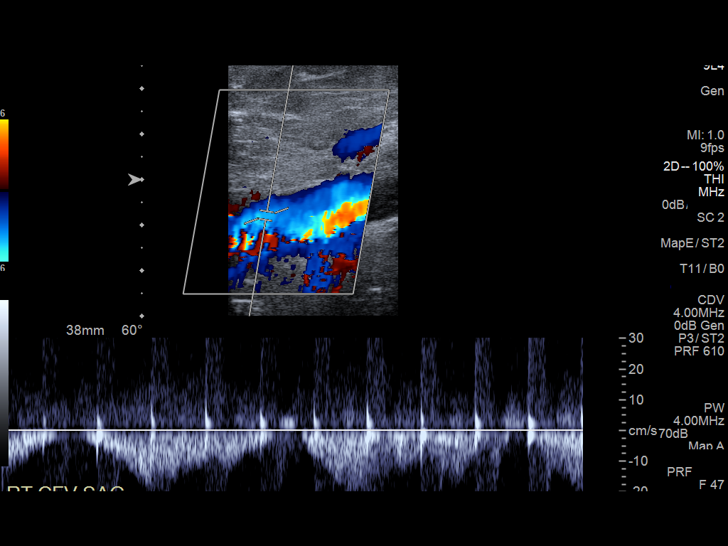
[im 5/29]
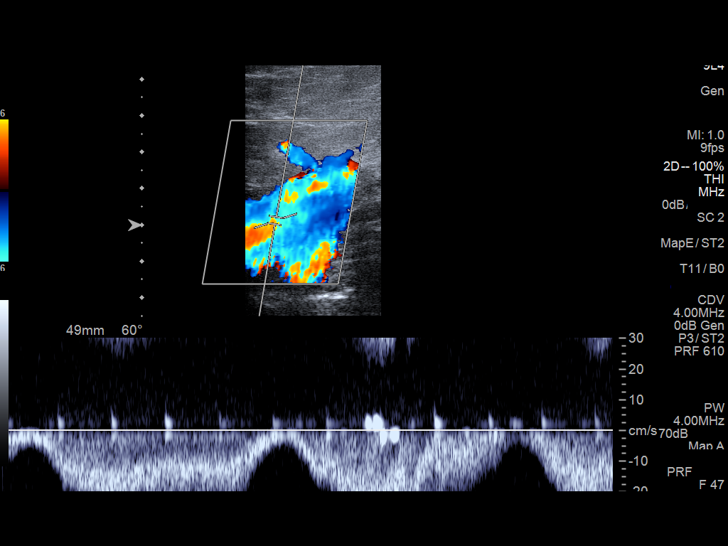
[im 8/29]
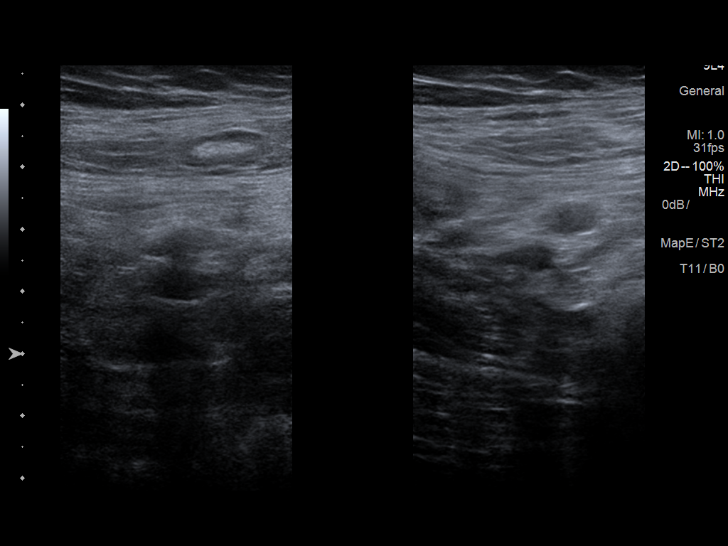
[im 10/29]
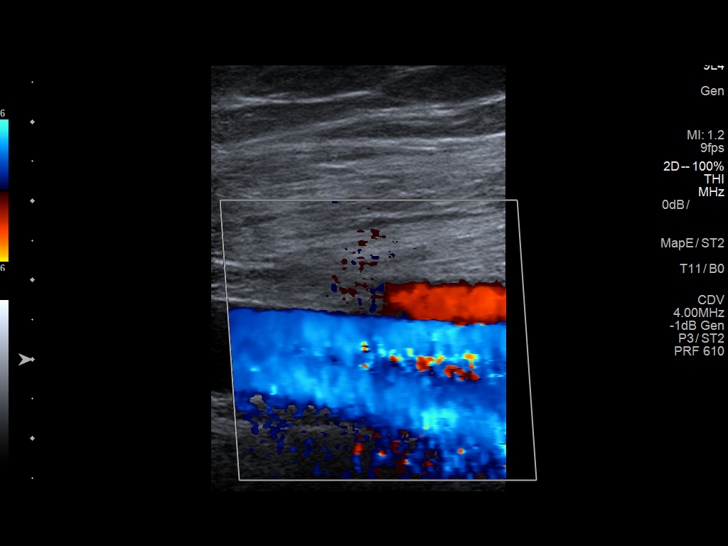
[im 13/29]
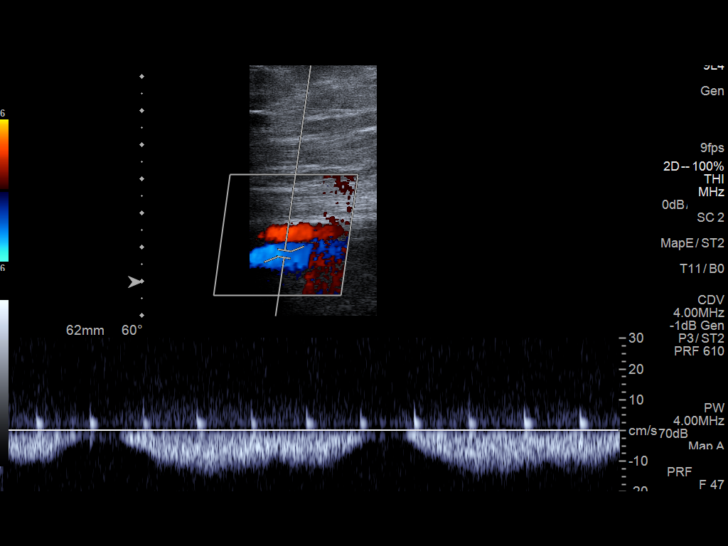
[im 15/29]
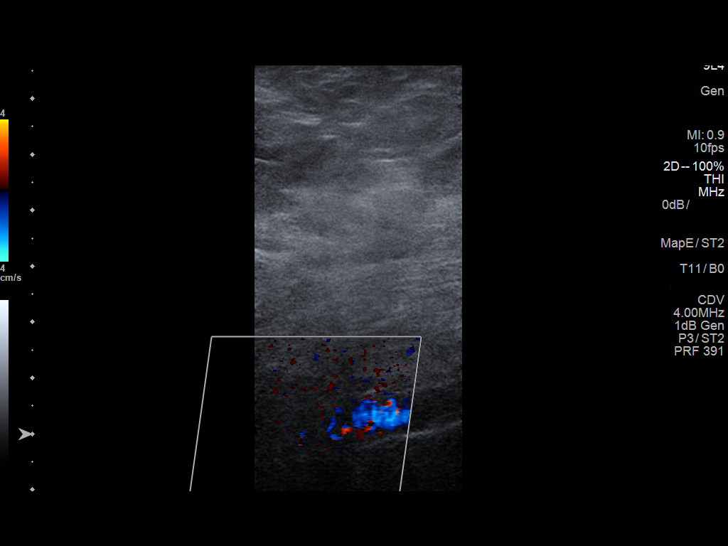
[im 16/29]
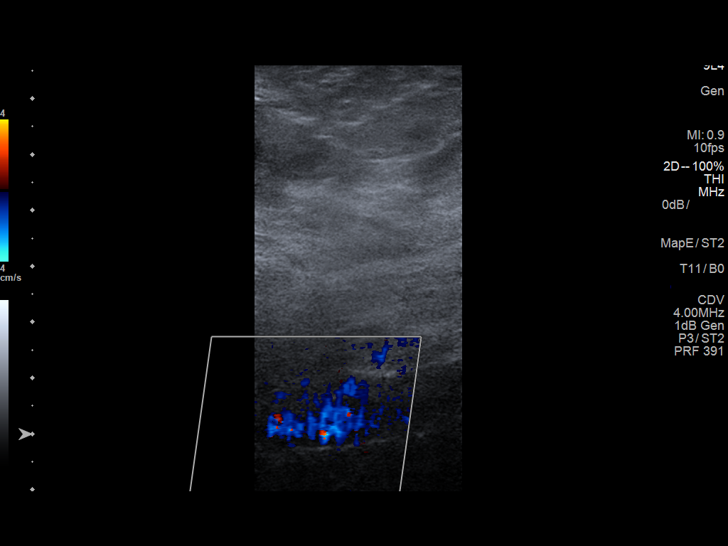
[im 19/29]
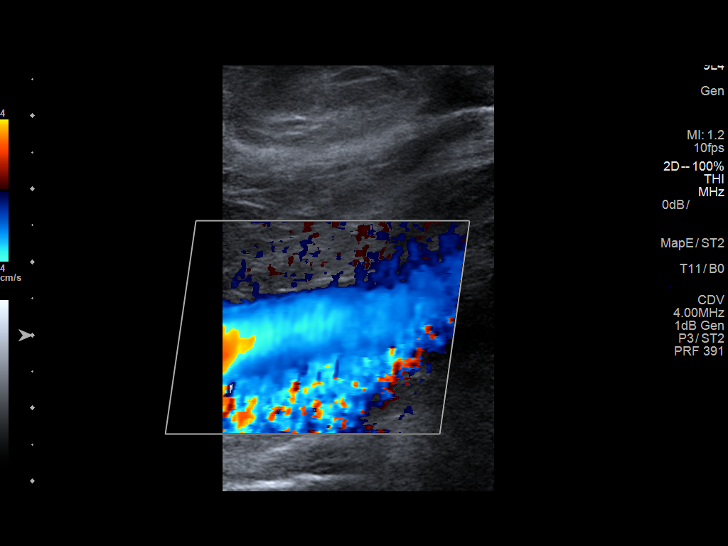
[im 21/29]
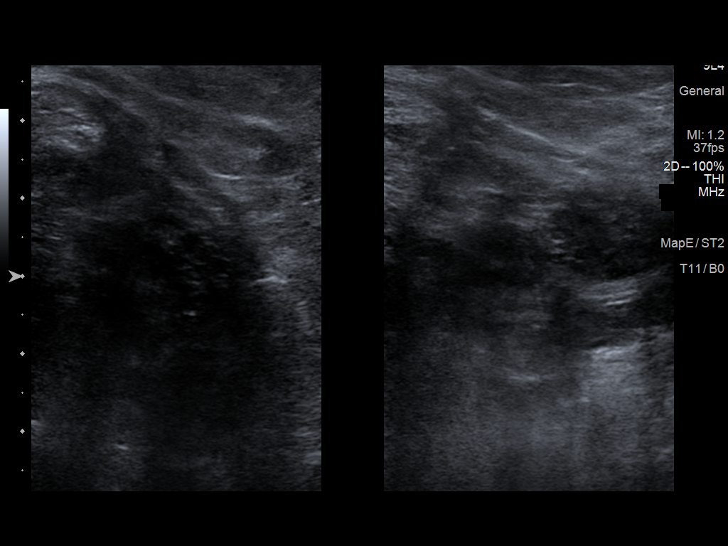
[im 24/29]
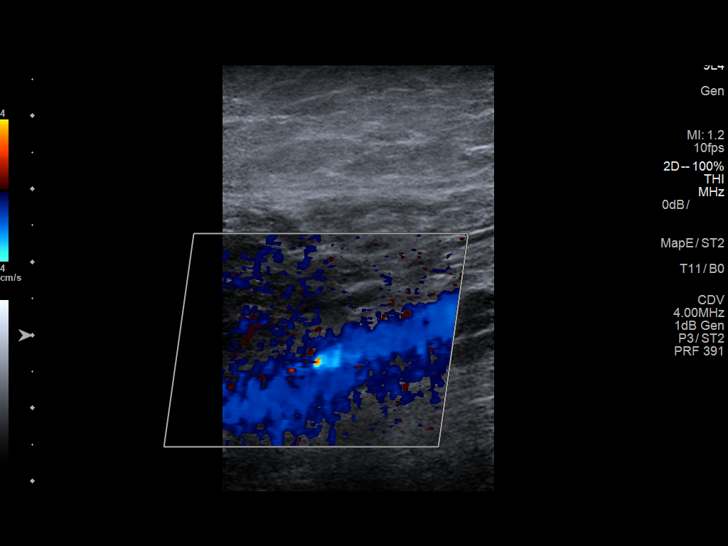
[im 26/29]
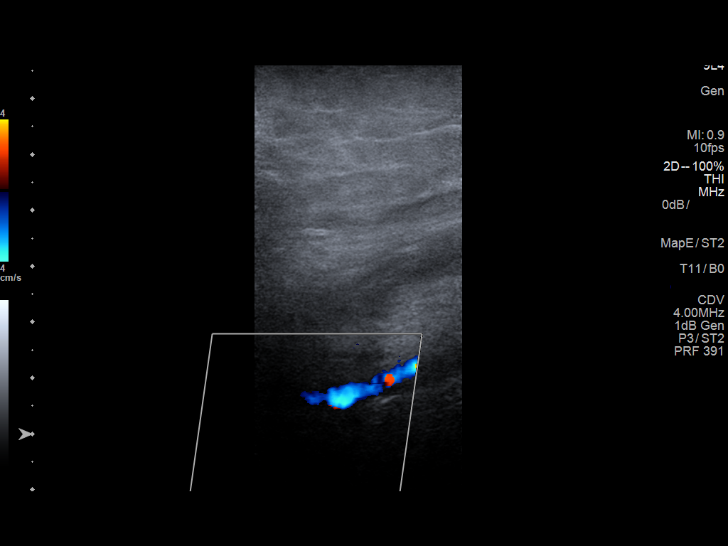
[im 29/29]
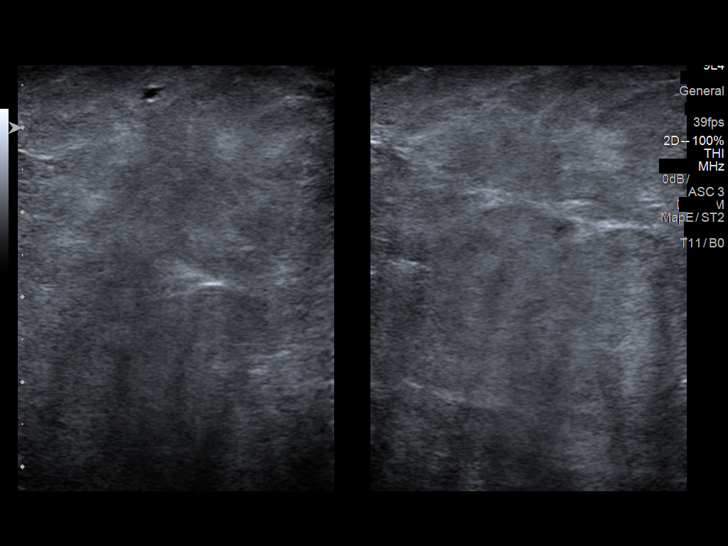

[13 of 24 positions shown; findings below may reference images not displayed]

FINDINGS: Contralateral Common Femoral Vein: Respiratory phasicity is normal
and symmetric with the symptomatic side. No evidence of thrombus.
Normal compressibility.

Common Femoral Vein: No evidence of thrombus. Normal
compressibility, respiratory phasicity and response to augmentation.

Saphenofemoral Junction: No evidence of thrombus. Normal
compressibility and flow on color Doppler imaging.

Profunda Femoral Vein: No evidence of thrombus. Normal
compressibility and flow on color Doppler imaging.

Femoral Vein: No evidence of thrombus. Normal compressibility,
respiratory phasicity and response to augmentation. Distal femoral
vein not visualized.

Popliteal Vein: No evidence of thrombus. Normal compressibility,
respiratory phasicity and response to augmentation.

Calf Veins: No evidence of thrombus. Normal compressibility and flow
on color Doppler imaging.

Superficial Great Saphenous Vein: No evidence of thrombus. Normal
compressibility.

Venous Reflux:  None.

Other Findings:  None.
IMPRESSION: No evidence of deep venous thrombosis.

Distal femoral vein not visualized due to body habitus.

## 2020-02-12 IMAGING — US US ABDOMEN COMPLETE
1 series · 13 of 25 positions shown · non-contrast
Comparison: Report only from remote abdominal ultrasound 12/19/1997

CLINICAL DATA: Epigastric pain for 1 month, worse after eating.
Bloating and belching. History of diabetes and cholecystectomy.

EXAM:
ABDOMEN ULTRASOUND COMPLETE

[Series 1: us abdomen complete · 13 of 60 slices shown]
[im 1/60]
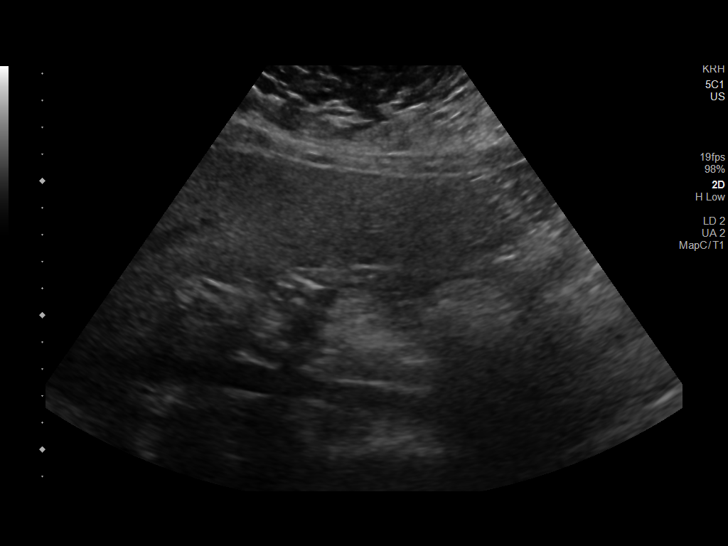
[im 5/60]
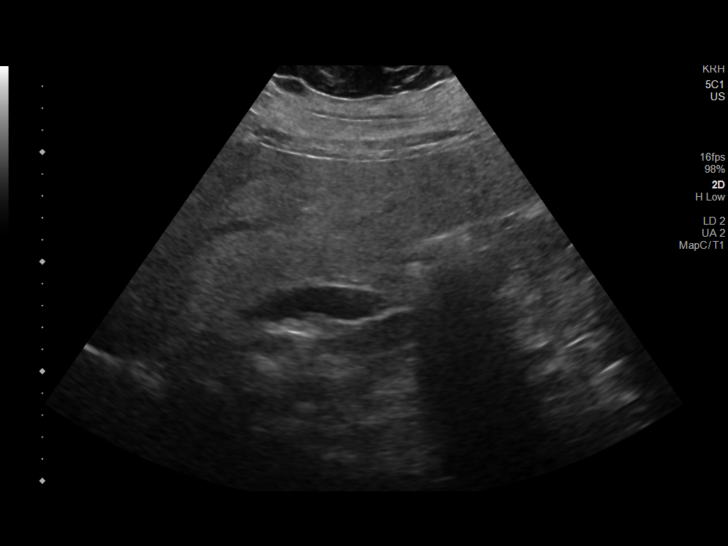
[im 10/60]
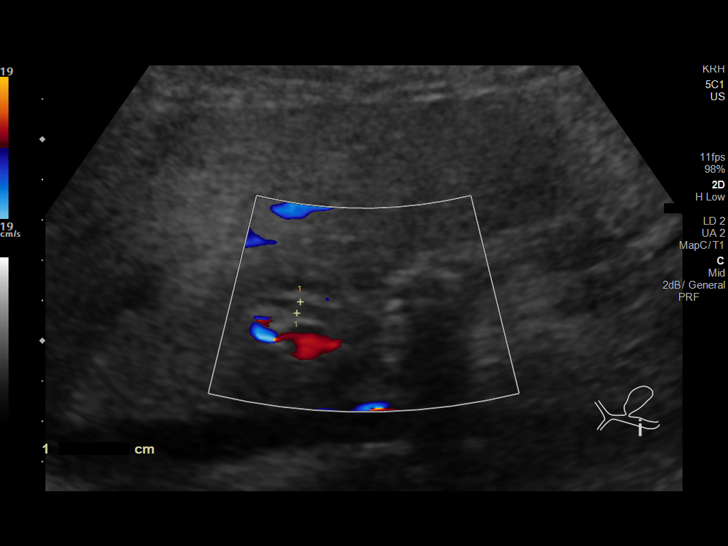
[im 15/60]
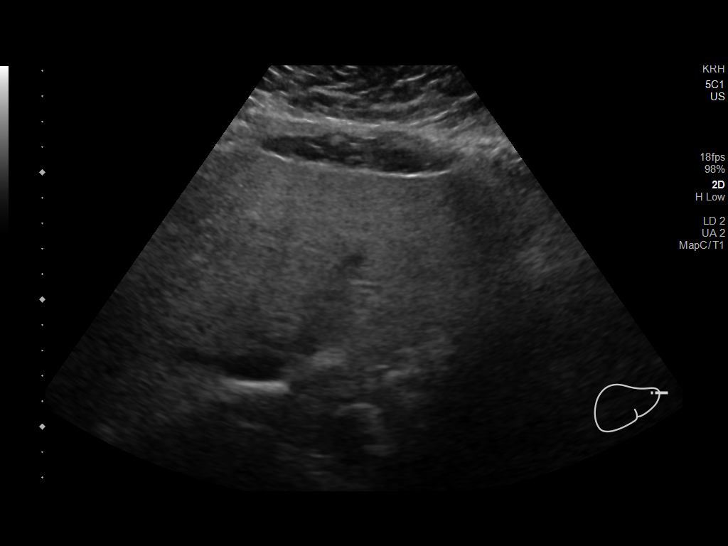
[im 20/60]
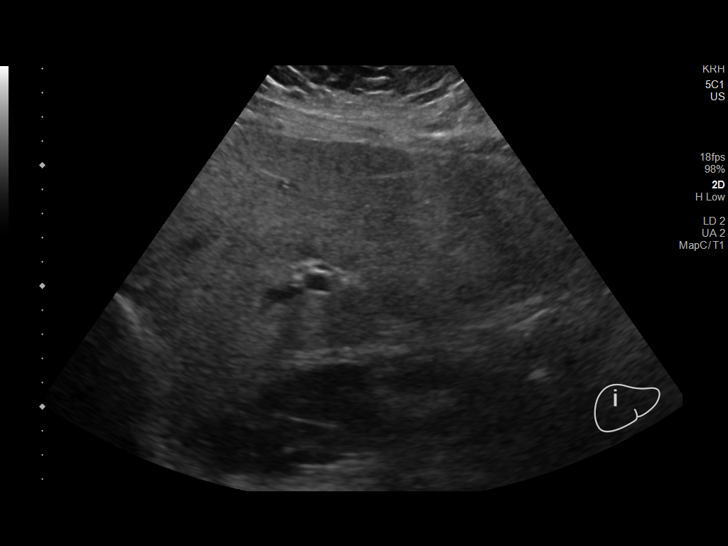
[im 25/60]
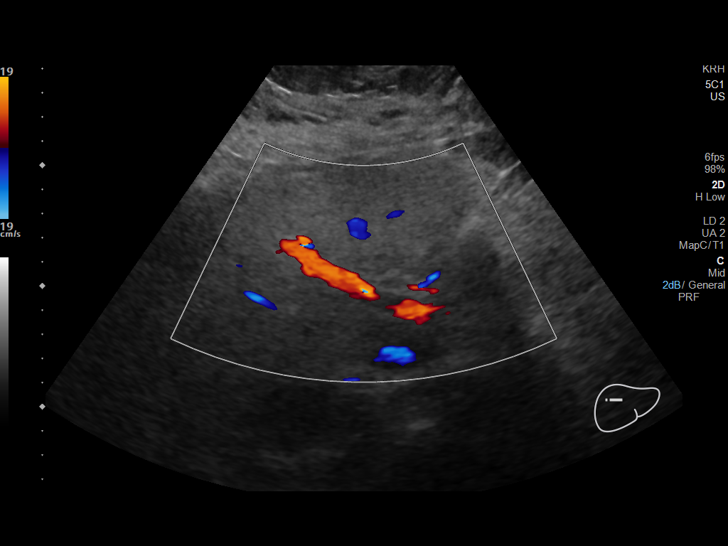
[im 30/60]
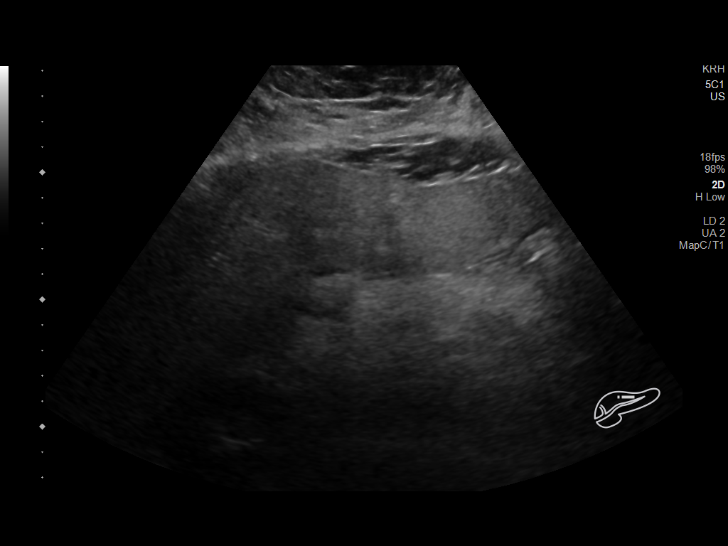
[im 35/60]
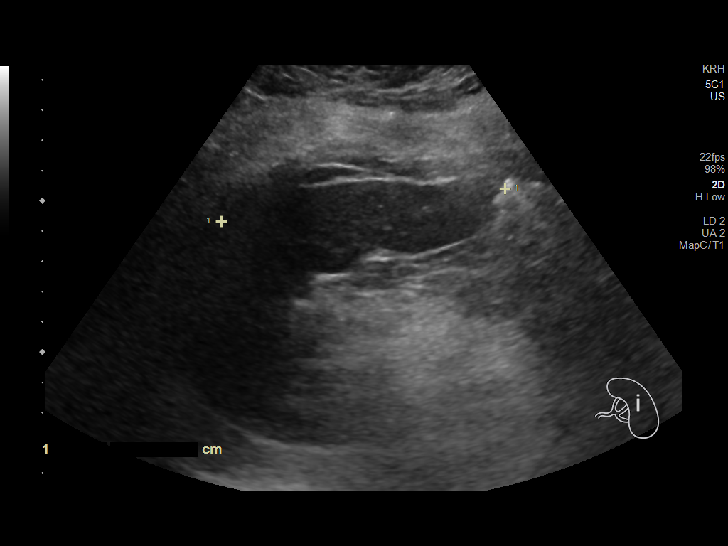
[im 40/60]
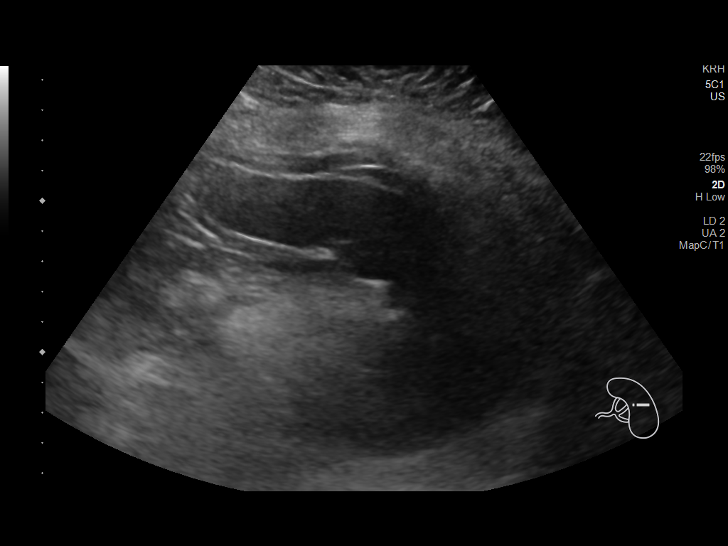
[im 45/60]
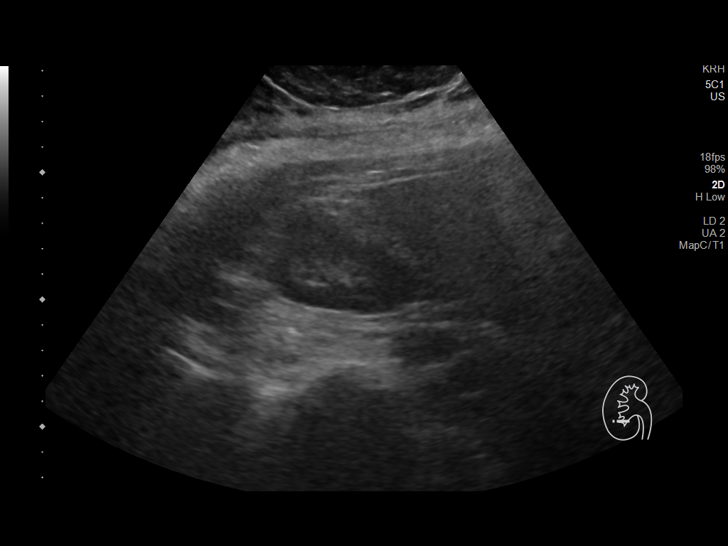
[im 50/60]
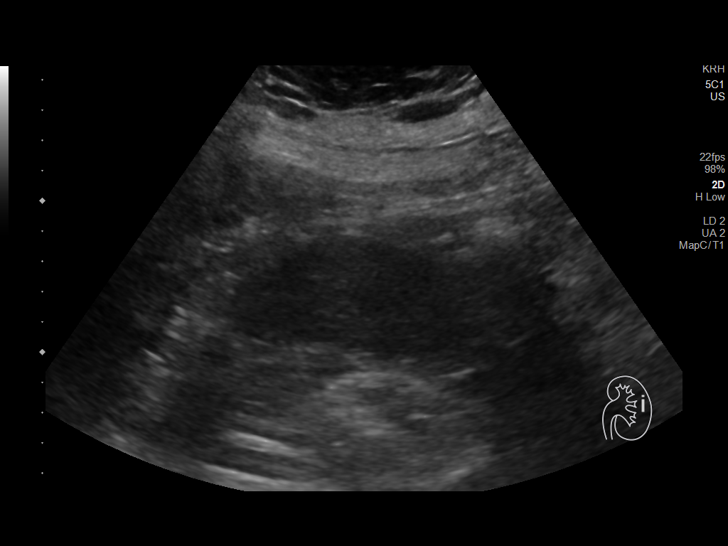
[im 55/60]
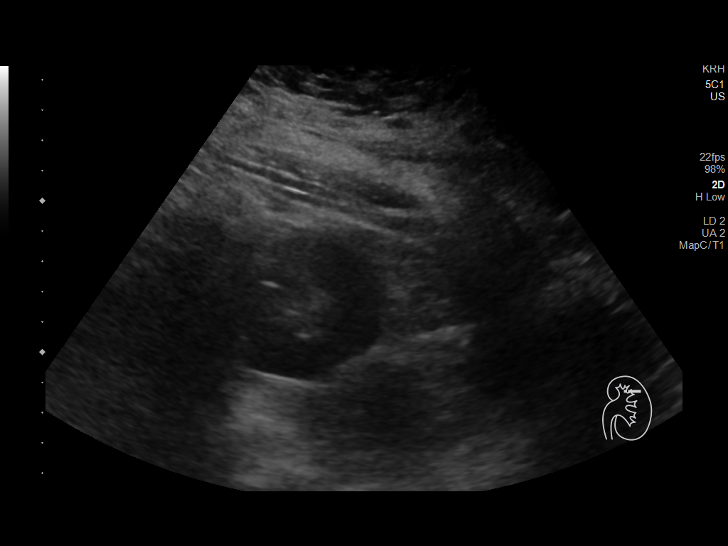
[im 60/60]
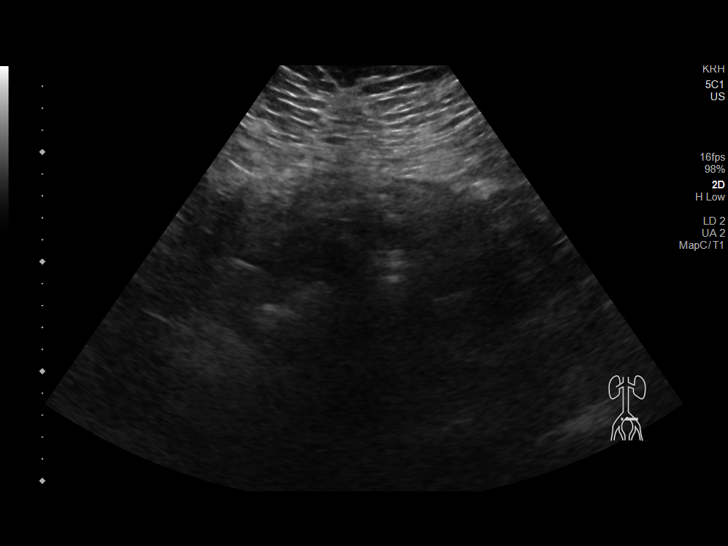

[13 of 25 positions shown; findings below may reference images not displayed]

FINDINGS: Gallbladder: Cholecystectomy.

Common bile duct: Diameter: 3 mm

Liver: The hepatic echogenicity is mildly increased. No focal
hepatic abnormalities are identified. Portal vein is patent on color
Doppler imaging with normal direction of blood flow towards the
liver.

IVC: No abnormality visualized.

Pancreas: The visualized portions appear unremarkable. Portions of
the pancreas are obscured by bowel gas.

Spleen: Size and appearance within normal limits.

Right Kidney: Length: 11.3 cm. Echogenicity within normal limits. No
mass or hydronephrosis visualized.

Left Kidney: Length: 10.7 cm. Echogenicity within normal limits. No
mass or hydronephrosis visualized.

Abdominal aorta: No aneurysm visualized.

Other findings: None.
IMPRESSION: 1. No acute findings or biliary dilatation post cholecystectomy.
2. Increased hepatic echogenicity, most typically due to steatosis.
3. Study is mildly limited by body habitus and bowel gas. Portions
of the pancreas, aorta and IVC are obscured.

## 2022-09-10 ENCOUNTER — Ambulatory Visit: Payer: Self-pay | Admitting: Obstetrics & Gynecology

## 2022-09-30 ENCOUNTER — Ambulatory Visit: Payer: BC Managed Care – PPO | Admitting: Medical

## 2022-09-30 ENCOUNTER — Encounter: Payer: Self-pay | Admitting: Medical

## 2022-09-30 VITALS — BP 170/98 | HR 100 | Resp 18 | Ht 65.0 in | Wt 362.0 lb

## 2022-09-30 DIAGNOSIS — M25552 Pain in left hip: Secondary | ICD-10-CM

## 2022-09-30 DIAGNOSIS — M6740 Ganglion, unspecified site: Secondary | ICD-10-CM | POA: Diagnosis not present

## 2022-09-30 DIAGNOSIS — I10 Essential (primary) hypertension: Secondary | ICD-10-CM

## 2022-09-30 DIAGNOSIS — R0683 Snoring: Secondary | ICD-10-CM

## 2022-09-30 DIAGNOSIS — E119 Type 2 diabetes mellitus without complications: Secondary | ICD-10-CM | POA: Diagnosis not present

## 2022-09-30 LAB — COMPREHENSIVE METABOLIC PANEL
ALT: 6 U/L (ref 0–35)
AST: 7 U/L (ref 0–37)
Albumin: 3.6 g/dL (ref 3.5–5.2)
Alkaline Phosphatase: 94 U/L (ref 39–117)
BUN: 11 mg/dL (ref 6–23)
CO2: 27 mEq/L (ref 19–32)
Calcium: 9.1 mg/dL (ref 8.4–10.5)
Chloride: 100 mEq/L (ref 96–112)
Creatinine, Ser: 0.67 mg/dL (ref 0.40–1.20)
GFR: 95.31 mL/min (ref >60.00)
Glucose, Bld: 294 mg/dL — ABNORMAL HIGH (ref 70–99)
Potassium: 4.6 mEq/L (ref 3.5–5.1)
Sodium: 135 mEq/L (ref 135–145)
Total Bilirubin: 0.3 mg/dL (ref 0.2–1.2)
Total Protein: 7.6 g/dL (ref 6.0–8.3)

## 2022-09-30 LAB — LIPID PANEL
Cholesterol: 123 mg/dL (ref 0–200)
HDL: 39.7 mg/dL (ref >39.00)
LDL Cholesterol: 69 mg/dL (ref 0–99)
NonHDL: 83.11
Total CHOL/HDL Ratio: 3
Triglycerides: 73 mg/dL (ref 0.0–149.0)
VLDL: 14.6 mg/dL (ref 0.0–40.0)

## 2022-09-30 LAB — HEMOGLOBIN A1C: Hgb A1c MFr Bld: 10.5 % — ABNORMAL HIGH (ref 4.6–6.5)

## 2022-09-30 MED ORDER — LOSARTAN POTASSIUM-HCTZ 100-12.5 MG PO TABS: 1.0000 | ORAL_TABLET | Freq: Every day | ORAL | 0 refills | Status: DC

## 2022-09-30 MED ORDER — METFORMIN HCL 500 MG PO TABS: 500.0000 mg | ORAL_TABLET | Freq: Two times a day (BID) | ORAL | 3 refills | Status: AC

## 2022-09-30 MED ORDER — ATORVASTATIN CALCIUM 10 MG PO TABS: 10.0000 mg | ORAL_TABLET | Freq: Every day | ORAL | 3 refills | Status: AC

## 2022-09-30 NOTE — Addendum Note (Signed)
Addended by: Anabel Halon on: 09/30/2022 08:03 PM   Modules accepted: Orders

## 2022-09-30 NOTE — Patient Instructions (Signed)
1. Hypertension, unspecified type Blood pressure is very high today and your neurologic exam is normal.  Minimal faint headache reported.  This could be blood pressure related.  Prescribing losartan HCTZ 100-12.5 mg daily.  Please make sure you start this medication.  If you develop worsening headache/neurologic signs/symptoms or cardiac symptoms then recommend being evaluated emergency department. - Comp Met (CMET) - Lipid panel - Hemoglobin A1c  2. Diabetes mellitus without complication ([place]) with obesity Will follow the labs and would recommend probable Ozempic for both diabetes and weight loss.  You mentioned about her weight loss surgery but I think that would be more riskier option.  Benefits versus risk discussed regarding Ozempic.  No history of pancreatitis.  No family history of thyroid medullary cancer. - Comp Met (CMET) - Lipid panel - Hemoglobin A1c  3. Pain of left hip Can use Tylenol for pain.  Do not use any NSAIDs such as ibuprofen or Aleve as those can increase blood pressure. - DG HIP UNILAT WITH PELVIS 2-3 VIEWS LEFT; Future  4. Ganglion cyst Referral placed to hand surgeon. - Ambulatory referral to Hand Surgery  5. Snores Placed referral to pulmonologist.  I think they will likely do the sleep study. - Ambulatory referral to Pulmonology   Follow-up in 7 to 10 days or sooner if needed.

## 2022-09-30 NOTE — Progress Notes (Signed)
Subjective:    Patient ID: Hannah Figueroa, female    DOB: Aug 14, 1961, 62 y.o.   MRN: [id number]  HPI  Pt in to get reestablish after almost 4 years.  Htn- hx. Bp very high. Low level ha now. No gross motor or sensory function deficits.  Sleep apnea- she states had studies years ago but never used cpap. More than 5 years since study.  Obese pt- struggled to lose weight in the past.   Pt is diabetic- last A1c was high. Sugar average was 209. Pt was notified but never follow up.  6 months of left hip pain. Pain on walking and changing positions.  Pt palm small nodule with feeling of numbness to hand.   Pt has already scheduled her papsmear.     Review of Systems  Constitutional:  Negative for chills.  HENT:  Negative for congestion.   Respiratory:  Negative for cough, chest tightness and wheezing.   Cardiovascular:  Negative for chest pain and palpitations.  Gastrointestinal:  Negative for abdominal pain, constipation, nausea and vomiting.  Genitourinary:  Negative for dysuria, flank pain and frequency.  Musculoskeletal:  Negative for arthralgias, back pain and neck pain.       Left hip pain.  Skin:  Negative for rash.  Neurological:  Positive for headaches. Negative for dizziness, seizures, syncope, weakness and numbness.       Minmal faint ha presently.  Psychiatric/Behavioral:  Negative for behavioral problems, dysphoric mood, hallucinations and suicidal ideas.    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  Tobacco Use   Smoking status: Former    Years: 20.00    Types: Cigarettes   Smokeless tobacco: Never  Vaping Use   Vaping  Use: Never used  Substance and Sexual Activity   Alcohol use: No    Comment: soc   Drug use: No   Sexual activity: Yes    Birth control/protection: Surgical  Other Topics Concern   Not on file  Social History Narrative   Not on file   Social Determinants of Health   Financial Resource Strain: Not on file  Food Insecurity: Not on file  Transportation Needs: Not on file  Physical Activity: Not on file  Stress: Not on file  Social Connections: Not on file  Intimate Partner Violence: Not on file    Past Surgical History:  Procedure Laterality Date   CESAREAN SECTION     CHOLECYSTECTOMY      No family history on file.  Allergies  Allergen Reactions   Meloxicam    Penicillins     No current outpatient medications on file prior to visit.   No current facility-administered medications on file prior to visit.    BP (!) 170/98   Pulse 100   Resp 18   Ht 5' 5"$  (1.651 m)   Wt (!) 362 lb (164.2 kg)   SpO2 96%   BMI 60.24 kg/m        Objective:   Physical Exam  General Mental Status- Alert. General Appearance- Not in acute distress.   Skin General: Color- Normal Color. Moisture- Normal  Moisture.  Neck Carotid Arteries- Normal color. Moisture- Normal Moisture. No carotid bruits. No JVD.  Chest and Lung Exam Auscultation: Breath Sounds:-Normal.  Cardiovascular Auscultation:Rythm- Regular. Murmurs & Other Heart Sounds:Auscultation of the heart reveals- No Murmurs.  Abdomen Inspection:-Inspeection Normal. Palpation/Percussion:Note:No mass. Palpation and Percussion of the abdomen reveal- Non Tender, Non Distended + BS, no rebound or guarding.   Neurologic Cranial Nerve exam:- CN III-XII intact(No nystagmus), symmetric smile. Drift Test:- No drift. Finger to Nose:- Normal/Intact Strength:- 5/5 equal and symmetric strength both upper and lower extremities.   Left hip- pain on palpation and on range of motion.    Assessment & Plan:   Patient  Instructions  1. Hypertension, unspecified type Blood pressure is very high today and your neurologic exam is normal.  Minimal faint headache reported.  This could be blood pressure related.  Prescribing losartan HCTZ 100-12.5 mg daily.  Please make sure you start this medication.  If you develop worsening headache/neurologic signs/symptoms or cardiac symptoms then recommend being evaluated emergency department. - Comp Met (CMET) - Lipid panel - Hemoglobin A1c  2. Diabetes mellitus without complication ([place]) with obesity Will follow the labs and would recommend probable Ozempic for both diabetes and weight loss.  You mentioned about her weight loss surgery but I think that would be more riskier option.  Benefits versus risk discussed regarding Ozempic.  No history of pancreatitis.  No family history of thyroid medullary cancer. - Comp Met (CMET) - Lipid panel - Hemoglobin A1c  3. Pain of left hip Can use Tylenol for pain.  Do not use any NSAIDs such as ibuprofen or Aleve as those can increase blood pressure. - DG HIP UNILAT WITH PELVIS 2-3 VIEWS LEFT; Future  4. Ganglion cyst Referral placed to hand surgeon. - Ambulatory referral to Hand Surgery  5. Snores Placed referral to pulmonologist.  I think they will likely do the sleep study. - Ambulatory referral to Pulmonology   Follow-up in 7 to 10 days or sooner if needed.   Mackie Pai, PA-C

## 2022-10-01 MED ORDER — ACCU-CHEK MULTICLIX LANCETS MISC: 12 refills | Status: AC

## 2022-10-01 MED ORDER — ACCU-CHEK AVIVA PLUS VI STRP: ORAL_STRIP | 12 refills | Status: AC

## 2022-10-01 MED ORDER — ACCU-CHEK AVIVA PLUS W/DEVICE KIT: PACK | 0 refills | Status: DC

## 2022-10-01 NOTE — Addendum Note (Signed)
Addended by: Jeronimo Greaves on: 10/01/2022 08:32 AM   Modules accepted: Orders

## 2022-10-08 ENCOUNTER — Ambulatory Visit (INDEPENDENT_AMBULATORY_CARE_PROVIDER_SITE_OTHER): Payer: BC Managed Care – PPO | Admitting: Obstetrics & Gynecology

## 2022-10-08 ENCOUNTER — Ambulatory Visit (HOSPITAL_BASED_OUTPATIENT_CLINIC_OR_DEPARTMENT_OTHER)
Admission: RE | Admit: 2022-10-08 | Discharge: 2022-10-08 | Disposition: A | Discharge status: Home / Self Care | Payer: BC Managed Care – PPO | Source: Ambulatory Visit | Attending: Obstetrics & Gynecology | Admitting: Obstetrics & Gynecology

## 2022-10-08 ENCOUNTER — Other Ambulatory Visit: Payer: Self-pay | Admitting: Obstetrics & Gynecology

## 2022-10-08 ENCOUNTER — Other Ambulatory Visit (HOSPITAL_COMMUNITY)
Admission: RE | Admit: 2022-10-08 | Discharge: 2022-10-08 | Disposition: A | Discharge status: Home / Self Care | Payer: BC Managed Care – PPO | Source: Ambulatory Visit | Attending: Obstetrics & Gynecology | Admitting: Obstetrics & Gynecology

## 2022-10-08 ENCOUNTER — Encounter: Payer: Self-pay | Admitting: General Practice

## 2022-10-08 ENCOUNTER — Encounter: Payer: Self-pay | Admitting: Obstetrics & Gynecology

## 2022-10-08 VITALS — BP 118/40 | HR 101 | Ht 65.0 in | Wt 355.0 lb

## 2022-10-08 DIAGNOSIS — N950 Postmenopausal bleeding: Secondary | ICD-10-CM

## 2022-10-08 DIAGNOSIS — Z01419 Encounter for gynecological examination (general) (routine) without abnormal findings: Secondary | ICD-10-CM

## 2022-10-08 DIAGNOSIS — N8500 Endometrial hyperplasia, unspecified: Secondary | ICD-10-CM | POA: Diagnosis not present

## 2022-10-08 DIAGNOSIS — Z1211 Encounter for screening for malignant neoplasm of colon: Secondary | ICD-10-CM

## 2022-10-08 DIAGNOSIS — N888 Other specified noninflammatory disorders of cervix uteri: Secondary | ICD-10-CM | POA: Diagnosis not present

## 2022-10-08 DIAGNOSIS — R9389 Abnormal findings on diagnostic imaging of other specified body structures: Secondary | ICD-10-CM | POA: Diagnosis not present

## 2022-10-08 DIAGNOSIS — Z1231 Encounter for screening mammogram for malignant neoplasm of breast: Secondary | ICD-10-CM

## 2022-10-08 NOTE — Patient Instructions (Addendum)
Postmenopausal Bleeding Postmenopausal bleeding is any bleeding that a woman has after she has entered menopause. Menopause is the end of a woman's fertile years. After menopause, a woman no longer ovulates and does not have menstrual periods. Therefore, she should no longer have bleeding from her vagina. Postmenopausal bleeding may have various causes, including: Menopausal hormone therapy (MHT). Endometrial atrophy. After menopause, low estrogen hormone levels cause the membrane that lines the uterus (endometrium) to become thin. You may have bleeding as the endometrium thins. Endometrial hyperplasia. This condition is caused by excess estrogen hormones and low levels of progesterone hormones. The excess estrogen causes the endometrium to thicken, which can lead to bleeding. In some cases, this can lead to cancer of the uterus. Endometrial cancer. Noncancerous growths (polyps) on the endometrium, the lining of the uterus, or the cervix. Uterine fibroids. These are noncancerous growths in or around the uterus muscle tissue that can cause heavy bleeding. Any type of postmenopausal bleeding, even if it appears to be a typical menstrual period, should be checked by your health care provider. Treatment will depend on the cause of the bleeding. Follow these instructions at home:  Pay attention to any changes in your symptoms. Let your health care provider know about them. Avoid using tampons and douches as told by your health care provider. Change your pads regularly. Get regular pelvic exams, including Pap tests, as told by your health care provider. Take iron supplements as told by your health care provider. Take over-the-counter and prescription medicines only as told by your health care provider. Keep all follow-up visits. This is important. Contact a health care provider if: You have new bleeding from the vagina after menopause. You have pain in your abdomen. Get help right away if: You have  a fever or chills. You have severe pain with bleeding. You are passing blood clots. You have heavy bleeding, need more than 1 pad an hour, and have never experienced this before. You have headaches or feel faint or dizzy. Summary Postmenopausal bleeding is any bleeding that a woman has after she has entered into menopause. Postmenopausal bleeding may have various causes. Treatment will depend on the cause of the bleeding. Any type of postmenopausal bleeding, even if it appears to be a typical menstrual period, should be checked by your health care provider. Be sure to pay attention to any changes in your symptoms and keep all follow-up visits. This information is not intended to replace advice given to you by your health care provider. Make sure you discuss any questions you have with your health care provider. Document Revised: 01/19/2020 Document Reviewed: 01/19/2020 Elsevier Patient Education  [place].  Endometrial Biopsy  An endometrial biopsy is a procedure to remove tissue samples from the endometrium, which is the lining of the uterus. The tissue that is removed can then be checked under a microscope for disease. This procedure is used to diagnose conditions such as endometrial cancer, endometrial tuberculosis, polyps, or other inflammatory conditions. This procedure may also be used to investigate uterine bleeding to determine where you are in your menstrual cycle or how your hormone levels are affecting the lining of the uterus. Tell a health care provider about: Any allergies you have. All medicines you are taking, including vitamins, herbs, eye drops, creams, and over-the-counter medicines. Any problems you or family members have had with anesthetic medicines. Any bleeding problems you have. Any surgeries you have had. Any medical conditions you have. Whether you are pregnant or may be pregnant.  What are the risks? Your health care provider will talk with you about  risks. These may include: Bleeding. Pelvic infection. Puncture of the wall of the uterus with the biopsy device (rare). Allergic reactions to medicines. What happens before the procedure? Keep a record of your menstrual cycles as told by your health care provider. You may need to schedule your procedure for a specific time in your cycle. Bring a sanitary pad in case you need to wear one after the procedure. Ask your health care provider about: Changing or stopping your regular medicines. These include any diabetes medicines or blood thinners you take. Taking medicines such as aspirin and ibuprofen. These medicines can thin your blood. Do not take these medicines unless your health care provider tells you to. Taking over-the-counter medicines, vitamins, herbs, and supplements. Plan to have someone take you home from the hospital or clinic. What happens during the procedure? You will lie on an exam table with your feet and legs supported as in a pelvic exam. Your health care provider will insert an instrument into your vagina to see your cervix. Your cervix will be cleansed with an antiseptic solution. A medicine (local anesthetic) will be used to numb the cervix. A forceps instrument will be used to hold your cervix steady for the biopsy. A thin, rod-like instrument (uterine sound) will be inserted through your cervix to determine the length of your uterus and the location where the biopsy sample will be removed. A thin, flexible tube (catheter) will be inserted through your cervix and into the uterus. The catheter will be used to collect the biopsy sample from your endometrial tissue. The tube and instruments will be removed, and the tissue sample will be sent to a lab for examination. The procedure may vary among health care providers and hospitals. What happens after the procedure? Your blood pressure, heart rate, breathing rate, and blood oxygen level will be monitored until you leave the  hospital or clinic. It is up to you to get the results of your procedure. Ask your health care provider, or the department that is doing the procedure, when your results will be ready. Summary An endometrial biopsy is a procedure to remove tissue samples from the endometrium, which is the lining of the uterus. This procedure is used to diagnose conditions such as endometrial cancer, endometrial tuberculosis, polyps, or other inflammatory conditions. It is up to you to get the results of your procedure. Ask your health care provider, or the department that is doing the procedure, when your results will be ready. This information is not intended to replace advice given to you by your health care provider. Make sure you discuss any questions you have with your health care provider. Document Revised: 11/19/2021 Document Reviewed: 11/19/2021 Elsevier Patient Education  [place] POST-PROCEDURE INSTRUCTIONS  You may take Ibuprofen, Aleve or Tylenol for pain if needed.  Cramping should resolve within in 24 hours.  You may have a small amount of spotting.  You should wear a mini pad for the next few days.  You may have intercourse after 24 hours.  You need to call if you have any pelvic pain, fever, heavy bleeding or foul smelling vaginal discharge.  Shower or bathe as normal  6. We will call you within one week with results or we will discuss   the results at your follow-up appointment if needed.

## 2022-10-08 NOTE — Progress Notes (Signed)
Subjective:     Hannah Figueroa is a 62 y.o. female here for a routine exam.  Current complaints: Pt reports continued daily bleeding. She reports that it is not as heavy as it used to be. Pt did not get her [place] from her last visit as ordered. She denies pain or other assoc sx. Pt was recently dx'd with DM and HTN. Currently being treated for both.      Gynecologic History Patient's last menstrual period was 10/01/2022. Contraception: post menopausal status Last Pap: 06/03/2019. Results were: normal Last mammogram: no recent mammograms  Obstetric History OB History  Gravida Para Term Preterm AB Living  4 3 3   1 3  $ SAB IAB Ectopic Multiple Live Births  1       3    # Outcome Date GA Lbr Len/2nd Weight Sex Delivery Anes PTL Lv  4 Term 12/25/84    F CS-LTranv   LIV  3 SAB 08/19/83          2 Term 01/21/83    M CS-LTranv   LIV  1 Term 04/26/81    F Vag-Spont   LIV   The following portions of the patient's history were reviewed and updated as appropriate: allergies, current medications, past family history, past medical history, past social history, past surgical history, and problem list.  Review of Systems Pertinent items are noted in HPI.    Objective:  BP (!) 118/40   Pulse (!) 101   Ht 5' 5"$  (1.651 m)   Wt (!) 355 lb (161 kg)   LMP 10/01/2022   BMI 59.08 kg/m  General Appearance:    Alert, cooperative, no distress, appears stated age  Head:    Normocephalic, without obvious abnormality, atraumatic  Eyes:    conjunctiva/corneas clear, EOM's intact, both eyes  Ears:    Normal external ear canals, both ears  Nose:   Nares normal, septum midline, mucosa normal, no drainage    or sinus tenderness  Throat:   Lips, mucosa, and tongue normal; teeth and gums normal  Neck:   Supple, symmetrical, trachea midline, no adenopathy;    thyroid:  no enlargement/tenderness/nodules  Back:     Symmetric, no curvature, ROM normal, no CVA tenderness  Lungs:     respirations unlabored  Chest  Wall:    No tenderness or deformity   Heart:    Regular rate and rhythm  Breast Exam:    No tenderness, masses, or nipple abnormality  Abdomen:     Soft, non-tender, bowel sounds active all four quadrants,    no masses, no organomegaly  Genitalia:    Normal female without lesion, discharge or tenderness     Extremities:   Extremities normal, atraumatic, no cyanosis or edema  Pulses:   2+ and symmetric all extremities  Skin:   Skin color, texture, turgor normal, no rashes or lesions    GYN PROCEDURE:  The indications for endometrial biopsy were reviewed.   Risks of the biopsy including cramping, bleeding, infection, uterine perforation, inadequate specimen and need for additional procedures  were discussed. The patient states she understands and agrees to undergo procedure today. Consent was signed. Time out was performed. A sterile speculum was placed in the patient's vagina and the cervix was prepped with Betadine. A single-toothed tenaculum was placed on the anterior lip of the cervix to stabilize it. The 3 mm pipelle was introduced into the endometrial cavity without difficulty to a depth of 10cm, and a moderate amount of  tissue was obtained and sent to pathology. The instruments were removed from the patient's vagina. Minimal bleeding from the cervix was noted. The patient tolerated the procedure well.      06/03/2019 Endometrial biopsy Clinical History: PMB (cm)   FINAL MICROSCOPIC DIAGNOSIS:   A. ENDOMETRIUM, BIOPSY:  - Endometrioid-type polyp, benign.   B. ENDOCERVIX, POLYPECTOMY:  - Endocervical-type polyp, benign.  Assessment:    Healthy female exam.  PMPB- need to r/o endo ca. [place] scheduled for today.    Plan:  Arla was seen today for annual exam.  Diagnoses and all orders for this visit:  Well female exam with routine gynecological exam -     Cytology - PAP( [facility])  Post-menopausal bleeding -     US PELVIS TRANSVAGINAL NON-OB (TV ONLY); Future -     Surgical  pathology  Breast cancer screening by mammogram -     MM 3D SCREEN BREAST BILATERAL; Future  Colon cancer screening -     Ambulatory referral to Gastroenterology   Routine post-procedure instructions were given to the patient. The patient will follow up to review the results and for further management.  F/u in 2 weeks for results of Bx and [place] .   Ambrosio Reuter L. Harraway-Smith, M.D., Cherlynn June

## 2022-10-08 NOTE — Addendum Note (Signed)
Addended by: Valentina Lucks on: 10/08/2022 03:48 PM   Modules accepted: Orders

## 2022-10-11 ENCOUNTER — Other Ambulatory Visit: Payer: Self-pay | Admitting: Medical

## 2022-10-15 LAB — CYTOLOGY - PAP
Comment: NEGATIVE
Diagnosis: NEGATIVE
High risk HPV: NEGATIVE

## 2022-10-16 ENCOUNTER — Ambulatory Visit: Payer: BC Managed Care – PPO | Admitting: Pulmonary Disease

## 2022-10-16 ENCOUNTER — Encounter: Payer: Self-pay | Admitting: Pulmonary Disease

## 2022-10-16 VITALS — BP 144/68 | HR 97 | Ht 65.0 in | Wt 365.0 lb

## 2022-10-16 DIAGNOSIS — R0681 Apnea, not elsewhere classified: Secondary | ICD-10-CM | POA: Diagnosis not present

## 2022-10-16 NOTE — Patient Instructions (Signed)
We will schedule you for a split-night study  Continue weight loss efforts  Call us with significant concerns  Follow-up in 3 to 4 months  Living With Sleep Apnea Sleep apnea is a condition in which breathing pauses or becomes shallow during sleep. Sleep apnea is most commonly caused by a collapsed or blocked airway. People with sleep apnea usually snore loudly. They may have times when they gasp and stop breathing for 10 seconds or more during sleep. This may happen many times during the night. The breaks in breathing also interrupt the deep sleep that you need to feel rested. Even if you do not completely wake up from the gaps in breathing, your sleep may not be restful and you feel tired during the day. You may also have a headache in the morning and low energy during the day, and you may feel anxious or depressed. How can sleep apnea affect me? Sleep apnea increases your chances of extreme tiredness during the day (daytime fatigue). It can also increase your risk for health conditions, such as: Heart attack. Stroke. Obesity. Type 2 diabetes. Heart failure. Irregular heartbeat. High blood pressure. If you have daytime fatigue as a result of sleep apnea, you may be more likely to: Perform poorly at school or work. Fall asleep while driving. Have difficulty with attention. Develop depression or anxiety. Have sexual dysfunction. What actions can I take to manage sleep apnea? Sleep apnea treatment  If you were given a device to open your airway while you sleep, use it only as told by your health care provider. You may be given: An oral appliance. This is a custom-made mouthpiece that shifts your lower jaw forward. A continuous positive airway pressure (CPAP) device. This device blows air through a mask when you breathe out (exhale). A nasal expiratory positive airway pressure (EPAP) device. This device has valves that you put into each nostril. A bi-level positive airway pressure  (BIPAP) device. This device blows air through a mask when you breathe in (inhale) and breathe out (exhale). You may need surgery if other treatments do not work for you. Sleep habits Go to sleep and wake up at the same time every day. This helps set your internal clock (circadian rhythm) for sleeping. If you stay up later than usual, such as on weekends, try to get up in the morning within 2 hours of your normal wake time. Try to get at least 7-9 hours of sleep each night. Stop using a computer, tablet, and mobile phone a few hours before bedtime. Do not take long naps during the day. If you nap, limit it to 30 minutes. Have a relaxing bedtime routine. Reading or listening to music may relax you and help you sleep. Use your bedroom only for sleep. Keep your television and computer out of your bedroom. Keep your bedroom cool, dark, and quiet. Use a supportive mattress and pillows. Follow your health care provider's instructions for other changes to sleep habits. Nutrition Do not eat heavy meals in the evening. Do not have caffeine in the later part of the day. The effects of caffeine can last for more than 5 hours. Follow your health care provider's or dietitian's instructions for any diet changes. Lifestyle     Do not drink alcohol before bedtime. Alcohol can cause you to fall asleep at first, but then it can cause you to wake up in the middle of the night and have trouble getting back to sleep. Do not use any products that contain  nicotine or tobacco. These products include cigarettes, chewing tobacco, and vaping devices, such as e-cigarettes. If you need help quitting, ask your health care provider. Medicines Take over-the-counter and prescription medicines only as told by your health care provider. Do not use over-the-counter sleep medicine. You can become dependent on this medicine, and it can make sleep apnea worse. Do not use medicines, such as sedatives and narcotics, unless told by  your health care provider. Activity Exercise on most days, but avoid exercising in the evening. Exercising near bedtime can interfere with sleeping. If possible, spend time outside every day. Natural light helps regulate your circadian rhythm. General information Lose weight if you need to, and maintain a healthy weight. Keep all follow-up visits. This is important. If you are having surgery, make sure to tell your health care provider that you have sleep apnea. You may need to bring your device with you. Where to find more information Learn more about sleep apnea and daytime fatigue from: American Sleep Association: sleepassociation.[place]: sleepfoundation.org National Heart, Lung, and Blood Institute: https://www.hartman-hill.biz/ Summary Sleep apnea is a condition in which breathing pauses or becomes shallow during sleep. Sleep apnea can cause daytime fatigue and other serious health conditions. You may need to wear a device while sleeping to help keep your airway open. If you are having surgery, make sure to tell your health care provider that you have sleep apnea. You may need to bring your device with you. Making changes to sleep habits, diet, lifestyle, and activity can help you manage sleep apnea. This information is not intended to replace advice given to you by your health care provider. Make sure you discuss any questions you have with your health care provider. Document Revised: 03/13/2021 Document Reviewed: 07/13/2020 Elsevier Patient Education  [place].

## 2022-10-16 NOTE — Progress Notes (Signed)
Hannah Figueroa    [id number]    [birth year]  Primary Care Physician:Saguier, Iris Pert  Referring Physician: Mackie Pai, PA-C [place],  [place] [id number]  Chief complaint:   Patient being seen for concern for obstructive sleep apnea  HPI:  Patient had had a sleep study performed about 5 years ago, did not receive CPAP at the time secondary to insurance issues  Willing to start treatment for sleep apnea present History of sleep apnea Has been told about snoring, has been told about apneas Usually goes to bed between 11 PM and 2 AM Takes about an hour or more to fall asleep Wakes up multiple times up to 3-6 times Final wake up time about 9 AM  Weight is stable -Has lost about 40 pounds recently with effort  Admits to dryness of her mouth in the morning Admits to headaches 2-3 times a week Choking episodes at night  Both parents did snore  Background history of hypertension, cardiac dysrhythmia, asthma, diabetes  Reformed smoker quit in 1990  Outpatient Encounter Medications as of 10/16/2022  Medication Sig   atorvastatin (LIPITOR) 10 MG tablet Take 1 tablet (10 mg total) by mouth daily.   Blood Glucose Monitoring Suppl (ACCU-CHEK AVIVA PLUS) w/Device KIT Check blood sugar BID  one time early morning fasting and second time after lunch or dinner.   glucose blood (ACCU-CHEK AVIVA PLUS) test strip Check blood sugar BID  one time early morning fasting and second time after lunch or dinner.   Lancets (ACCU-CHEK MULTICLIX) lancets Check blood sugar BID  one time early morning fasting and second time after lunch or dinner.   losartan-hydrochlorothiazide (HYZAAR) 100-12.5 MG tablet Take 1 tablet by mouth daily.   metFORMIN (GLUCOPHAGE) 500 MG tablet Take 1 tablet (500 mg total) by mouth 2 (two) times daily with a meal.   No facility-administered encounter medications on file as of 10/16/2022.    Allergies as of 10/16/2022 - Review  Complete 10/16/2022  Allergen Reaction Noted   Meloxicam  10/26/2016   Penicillins  02/28/2017    Past Medical History:  Diagnosis Date   Anemia    Diabetes mellitus without complication ([place])     Past Surgical History:  Procedure Laterality Date   CESAREAN SECTION     CHOLECYSTECTOMY      Family History  Problem Relation Age of Onset   Hypertension Mother    Diabetes Mother    Asthma Father    Diabetes Father    Heart attack Father     Social History   Socioeconomic History   Marital status: Married    Spouse name: Not on file   Number of children: Not on file   Years of education: Not on file   Highest education level: Not on file  Occupational History   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  Tobacco Use   Smoking status: Former    Years: 20.00    Types: Cigarettes   Smokeless tobacco: Never  Vaping Use   Vaping Use: Never used  Substance and Sexual Activity   Alcohol use: No    Comment: soc   Drug use: No   Sexual activity: Yes    Birth control/protection: Surgical  Other Topics Concern   Not on file  Social History Narrative   Not on file   Social Determinants of Health   Financial Resource Strain: Not on file  Food Insecurity: Not on file  Transportation Needs: Not on file  Physical Activity: Not on file  Stress: Not on file  Social Connections: Not on file  Intimate Partner Violence: Not on file    Review of Systems  Constitutional:  Positive for fatigue.  Psychiatric/Behavioral:  Positive for sleep disturbance.     Vitals:   10/16/22 1352  BP: (!) 144/68  Pulse: 97  SpO2: 100%     Physical Exam Constitutional:      Appearance: She is obese.  HENT:     Head: Normocephalic.     Nose: Nose normal.     Mouth/Throat:     Mouth: Mucous membranes are moist.  Eyes:     General: No scleral icterus.    Pupils: Pupils are equal, round, and reactive to light.  Cardiovascular:     Rate and Rhythm: Normal rate and regular  rhythm.     Heart sounds: No murmur heard.    No friction rub.  Pulmonary:     Effort: No respiratory distress.     Breath sounds: No stridor. No wheezing or rhonchi.  Musculoskeletal:     Cervical back: No rigidity or tenderness.  Neurological:     Mental Status: She is alert.  Psychiatric:        Mood and Affect: Mood normal.       10/16/2022    1:00 PM  Results of the Epworth flowsheet  Sitting and reading 0  Watching TV 0  Sitting, inactive in a public place (e.g. a theatre or a meeting) 0  As a passenger in a car for an hour without a break 0  Lying down to rest in the afternoon when circumstances permit 2  Sitting and talking to someone 0  Sitting quietly after a lunch without alcohol 0  In a car, while stopped for a few minutes in traffic 0  Total score 2    Data Reviewed: Previous sleep study not on record  Assessment:  Patient with background history of obstructive sleep apnea Class III obesity Previous diagnosis of obstructive sleep apnea, not initiated on treatment  History of hypertension which is not controlled at present  Pathophysiology of sleep disordered breathing discussed with the patient  Treatment options for sleep disordered breathing discussed with the patient Risks with not treating sleep disordered breathing discussed with patient  Plan/Recommendations: Schedule patient for an in lab split-night study  Encouraged to continue weight loss efforts  Options of treatment discussed include CPAP therapy, surgical intervention, oral device, inspire device  Tentative follow-up in 3 to 4 months  Encouraged to call with significant concerns   Sherrilyn Rist MD [facility] 10/16/2022, 2:36 PM  CC: Mackie Pai, PA-C

## 2022-10-17 LAB — SURGICAL PATHOLOGY

## 2022-10-22 ENCOUNTER — Encounter: Payer: Self-pay | Admitting: Obstetrics & Gynecology

## 2022-10-22 ENCOUNTER — Other Ambulatory Visit: Payer: Self-pay | Admitting: Medical

## 2022-10-22 ENCOUNTER — Ambulatory Visit: Payer: BC Managed Care – PPO | Admitting: Obstetrics & Gynecology

## 2022-10-22 VITALS — BP 158/74 | HR 98 | Ht 65.0 in | Wt 366.0 lb

## 2022-10-22 DIAGNOSIS — R9389 Abnormal findings on diagnostic imaging of other specified body structures: Secondary | ICD-10-CM | POA: Diagnosis not present

## 2022-10-22 DIAGNOSIS — N939 Abnormal uterine and vaginal bleeding, unspecified: Secondary | ICD-10-CM

## 2022-10-22 MED ORDER — MEGESTROL ACETATE 40 MG PO TABS: 40.0000 mg | ORAL_TABLET | Freq: Two times a day (BID) | ORAL | 3 refills | Status: DC

## 2022-10-22 NOTE — Progress Notes (Signed)
Patient states bleeding stopped last week. Kathrene Alu RN

## 2022-10-22 NOTE — Progress Notes (Signed)
History:  62 y.o. [id number] here with her husband today for f/u of [place] and endo bx results.  Pt denies change in sx.     The following portions of the patient's history were reviewed and updated as appropriate: allergies, current medications, past family history, past medical history, past social history, past surgical history and problem list.  Review of Systems:  Pertinent items are noted in HPI.    Objective:  Physical Exam Blood pressure (!) 172/53, pulse 98, height '5\' 5"'$  (1.651 m), weight (!) 366 lb (166 kg), last menstrual period 10/01/2022.  CONSTITUTIONAL: Well-developed, well-nourished female in no acute distress.  HENT:  Normocephalic, atraumatic EYES: Conjunctivae and EOM are normal. No scleral icterus.  NECK: Normal range of motion SKIN: Skin is warm and dry. No rash noted. Not diaphoretic.No pallor. [place]: Alert and oriented to person, place, and time. Normal coordination.   Labs and Imaging US PELVIC COMPLETE WITH TRANSVAGINAL  Result Date: 10/08/2022 CLINICAL DATA:  Postmenopausal bleeding EXAM: TRANSABDOMINAL AND TRANSVAGINAL ULTRASOUND OF PELVIS TECHNIQUE: Both transabdominal and transvaginal ultrasound examinations of the pelvis were performed. Transabdominal technique was performed for global imaging of the pelvis including uterus, ovaries, adnexal regions, and pelvic cul-de-sac. It was necessary to proceed with endovaginal exam following the transabdominal exam to visualize the uterus, endometrium, and ovaries. COMPARISON:  None Available. FINDINGS: Uterus Measurements: 10.7 x 6.1 x 7.2 cm = volume: 245 mL. Anteverted. Nabothian cysts at cervix. Heterogeneous myometrium. Suboptimal visualization of the uterus on both transabdominal and transvaginal imaging due to body habitus and bowel. On transabdominal imaging, a hypoechoic soft tissue mass is seen anterior RIGHT of the uterus, 4.3 x 4.8 x 5.0 cm in size, potentially an exophytic leiomyoma though this is incompletely  characterized and not definitively of uterine origin. Endometrium Thickness: 13 mm. Poorly visualized. No endometrial fluid grossly seen. Right ovary Not visualized, likely obscured by bowel Left ovary Not visualized, likely obscured by bowel Other findings No free pelvic fluid or adnexal masses. IMPRESSION: Nonvisualization of ovaries. Suboptimal visualization of the uterus due to body habitus and bowel. 5.0 cm diameter hypoechoic soft tissue mass anterior to RIGHT of the uterus, could potentially represent an exophytic leiomyoma though this is incompletely characterized and not of confirmed uterine origin; this could be further characterized/localized by MR imaging or less optimally CT. Thickened endometrial complex 13 mm thick; In the setting of post-menopausal bleeding, endometrial sampling is indicated to exclude carcinoma. If results are benign, sonohysterogram should be considered for focal lesion work-up. (Ref: Radiological Reasoning: Algorithmic Workup of Abnormal Vaginal Bleeding with Endovaginal Sonography and Sonohysterography. AJR [id number]) Electronically Signed   By: Lavonia Dana M.D.   On: 10/08/2022 16:10     10/08/2022 FINAL MICROSCOPIC DIAGNOSIS:   A. ENDOMETRIUM, BIOPSY:  - Benign endometrial polyp showing hyperplasia without any atypia  - Negative for malignancy   COMMENT:   Dr. Arby Barrette and Dr. Alric Seton reviewed the case and concur with the  above diagnosis.   GROSS DESCRIPTION:   A. Received in formalin labeled with the patients name and "Endo bx" is  a 2.3 x 2.0 x 0.3 cm aggregate of red-brown soft tissue fragments,  submitted in toto in a single cassette(s).   Assessment & Plan:  Diagnoses and all orders for this visit:  Abnormal uterine bleeding (AUB)  Thickened endometrium   Patient desires surgical management with hysteroscopy with D&C and possible endometrial ablation. The risks of surgery were discussed in detail with the patient including but not  limited  to: bleeding which may require transfusion or reoperation; infection which may require prolonged hospitalization or re-hospitalization and antibiotic therapy; injury to bowel, bladder, ureters and major vessels or other surrounding organs; need for additional procedures including laparotomy; thromboembolic phenomenon, incisional problems and other postoperative or anesthesia complications.  Patient was told that the likelihood that her condition and symptoms will be treated effectively with this surgical management was very high; the postoperative expectations were also discussed in detail. The patient also understands the alternative treatment options which were discussed in full. All questions were answered.  She was told that she will be contacted by our surgical scheduler regarding the time and date of her surgery; routine preoperative instructions of having nothing to eat or drink after midnight on the day prior to surgery and also coming to the hospital 1 1/2 hours prior to her time of surgery were also emphasized.  She was told she may be called for a preoperative appointment about a week prior to surgery and will be given further preoperative instructions at that visit. Printed patient education handouts about the procedure were given to the patient to review at home.   Megace '40mg'$  bid   Javan Gonzaga L. Harraway-Smith, M.D., Cherlynn June

## 2022-10-24 ENCOUNTER — Encounter: Payer: Self-pay | Admitting: Obstetrics & Gynecology

## 2022-10-27 ENCOUNTER — Inpatient Hospital Stay (HOSPITAL_BASED_OUTPATIENT_CLINIC_OR_DEPARTMENT_OTHER): Admission: RE | Admit: 2022-10-27 | Payer: BC Managed Care – PPO | Source: Ambulatory Visit

## 2022-11-03 ENCOUNTER — Ambulatory Visit: Payer: BC Managed Care – PPO | Admitting: Medical

## 2022-11-03 ENCOUNTER — Other Ambulatory Visit: Payer: Self-pay | Admitting: Medical

## 2022-11-03 ENCOUNTER — Encounter: Payer: Self-pay | Admitting: Medical

## 2022-11-03 VITALS — BP 165/85 | HR 90 | Temp 98.5°F | Resp 18 | Ht 65.0 in | Wt 356.0 lb

## 2022-11-03 DIAGNOSIS — D649 Anemia, unspecified: Secondary | ICD-10-CM | POA: Diagnosis not present

## 2022-11-03 DIAGNOSIS — R42 Dizziness and giddiness: Secondary | ICD-10-CM | POA: Diagnosis not present

## 2022-11-03 DIAGNOSIS — R002 Palpitations: Secondary | ICD-10-CM | POA: Diagnosis not present

## 2022-11-03 DIAGNOSIS — I10 Essential (primary) hypertension: Secondary | ICD-10-CM | POA: Diagnosis not present

## 2022-11-03 DIAGNOSIS — D72829 Elevated white blood cell count, unspecified: Secondary | ICD-10-CM

## 2022-11-03 MED ORDER — AMLODIPINE BESYLATE 10 MG PO TABS: 10.0000 mg | ORAL_TABLET | Freq: Every day | ORAL | 11 refills | Status: AC

## 2022-11-03 NOTE — Patient Instructions (Addendum)
1. Hypertension, unspecified type Blood pressure excessively high presently but not as bad as it was when I first prescribed medication.  Almost daily described side effects lasting about 1-1/2 hours after use of the losartan/HCTZ.  Losartan can cause dizziness but not common side effect that I see.  In light of your associated onset of signs/symptoms decided to prescribe the amlodipine 10 mg daily.  Will follow metabolic panel and decide on adding back diuretic provided no significant electrolyte abnormality. - EKG 12-Lead - Comp Met (CMET)  2. Palpitation,dizziness and head pressure associated with losartan hctz consistently. No current signs and symptoms presently except for minimal frontal sinus pressure.  The fluttering and dizziness not present as you have not taking daily BP medication today.  Frontal sinus region pressure might be related to elevated blood pressure.    Current neurologic exam normal.  EKG showed normal sinus rhythm.  Will see if your signs and symptoms stop with discontinuation of losartan. Decision on HCTZ pending blood work results.  EKG showed normal sinus rhythm.  If you have any persisting recurrent cardiac or neurologic signs symptoms despite medication changes then recommend emergency department evaluation.    Might need to refer you to a cardiologist in the near future.  Will wait and decide after blood pressure review on follow-up.  - EKG 12-Lead(normal sinus rhythm) - Comp Met (CMET)  4. Anemia, unspecified type Recent moderate to heavy bleeding related to uterine fibroid.  Will assess labs to see if contributing factors to the above. - CBC w/Diff - Iron   Follow-up in 7 days or sooner if needed.

## 2022-11-03 NOTE — Progress Notes (Signed)
Subjective:    Patient ID: Hannah Figueroa, female    DOB: 03/16/1961, 62 y.o.   MRN: [id number]  HPI Pt in for follow up.  Pt has some concern about medications side effects.  Pt did not take her bp medicine today. Pt states at times she will feel heart flutters, dizziness and occasional head pressure when she takes losartan- hctz. She states will happen about 1.5 hours after she takes med. Last time had heart flutters was yesterday after took med. Pt states occurs daily. She notes she did skipped 3 days intermittently not including today. She states those days did not have heart flutters and dizziness.   Today mild frontal ha. But not dizziness or having flutters. No  cardiac symptoms. No gross sensory  or motor function deficits.   Pt also diabetic- on metformin. Pt sugars are ranging from 150-200. Pt did get call on diabetic education.  On review hx of mild anemia. Some moderate to severe vaginal bleeding related to fibroid. Pt sees gyn.     Review of Systems  Constitutional:  Negative for chills, fatigue and fever.  HENT:  Negative for congestion, dental problem and ear discharge.   Respiratory:  Negative for cough, chest tightness, shortness of breath and wheezing.   Cardiovascular:  Negative for chest pain and palpitations.  Gastrointestinal:  Negative for abdominal pain, blood in stool and constipation.  Genitourinary:  Negative for dysuria and flank pain.  Musculoskeletal:  Negative for back pain and joint swelling.  Skin:  Negative for rash.  Neurological:  Negative for dizziness, light-headedness and headaches.       Faint frontal sinus pressure presently.  Hematological:  Negative for adenopathy. Does not bruise/bleed easily.  Psychiatric/Behavioral:  Negative for behavioral problems, dysphoric mood, hallucinations and suicidal ideas. The patient is not nervous/anxious.     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  Tobacco Use   Smoking status: Former    Years: 20    Types: Cigarettes   Smokeless tobacco: Never  Scientific laboratory technician Use: Never used  Substance and Sexual Activity   Alcohol use: No    Comment: soc   Drug use: No   Sexual activity: Yes    Birth control/protection: Surgical  Other Topics Concern   Not on file  Social History Narrative   Not on file   Social Determinants of Health   Financial Resource Strain: Not on file  Food Insecurity: Not on file  Transportation Needs: Not on file  Physical Activity: Not on file  Stress: Not on file  Social Connections: Not on file  Intimate Partner Violence: Not on file    Past Surgical History:  Procedure Laterality Date   CESAREAN SECTION     CHOLECYSTECTOMY      Family History  Problem Relation Age of Onset   Hypertension Mother    Diabetes Mother    Asthma Father    Diabetes Father    Heart attack Father     Allergies  Allergen Reactions   Meloxicam    Penicillins     Current Outpatient  Medications on File Prior to Visit  Medication Sig Dispense Refill   atorvastatin (LIPITOR) 10 MG tablet Take 1 tablet (10 mg total) by mouth daily. 90 tablet 3   Blood Glucose Monitoring Suppl (ACCU-CHEK AVIVA PLUS) w/Device KIT Check blood sugar BID  one time early morning fasting and second time after lunch or dinner. 1 kit 0   glucose blood (ACCU-CHEK AVIVA PLUS) test strip Check blood sugar BID  one time early morning fasting and second time after lunch or dinner. 100 each 12   Lancets (ACCU-CHEK MULTICLIX) lancets Check blood sugar BID  one time early morning fasting and second time after lunch or dinner. 100 each 12    losartan-hydrochlorothiazide (HYZAAR) 100-12.5 MG tablet TAKE 1 TABLET BY MOUTH EVERY DAY 30 tablet 0   megestrol (MEGACE) 40 MG tablet Take 1 tablet (40 mg total) by mouth 2 (two) times daily. Can increase to two tablets twice a day in the event of heavy bleeding 60 tablet 3   metFORMIN (GLUCOPHAGE) 500 MG tablet Take 1 tablet (500 mg total) by mouth 2 (two) times daily with a meal. 60 tablet 3   No current facility-administered medications on file prior to visit.    BP (!) 165/85   Pulse 90   Temp 98.5 F (36.9 C)   Resp 18   Ht 5\' 5"  (1.651 m)   Wt (!) 356 lb (161.5 kg)   LMP 10/01/2022   SpO2 100%   BMI 59.24 kg/m        Objective:   Physical Exam  General Mental Status- Alert. General Appearance- Not in acute distress.   Skin General: Color- Normal Color. Moisture- Normal Moisture.  Neck Carotid Arteries- Normal color. Moisture- Normal Moisture. No carotid bruits. No JVD.  Chest and Lung Exam Auscultation: Breath Sounds:-Normal.  Cardiovascular Auscultation:Rythm- Regular. Murmurs & Other Heart Sounds:Auscultation of the heart reveals- No Murmurs.  Abdomen Inspection:-Inspeection Normal. Palpation/Percussion:Note:No mass. Palpation and Percussion of the abdomen reveal- Non Tender, Non Distended + BS, no rebound or guarding.    Neurologic Cranial Nerve exam:- CN III-XII intact(No nystagmus), symmetric smile. Drift Test:- No drift. Finger to Nose:- Normal/Intact Strength:- 5/5 equal and symmetric strength both upper and lower extremities.   Lower ext- calf large but symmetric. No edema. Negative homans signs.    Assessment & Plan:   Patient Instructions  1. Hypertension, unspecified type Blood pressure excessively high presently but not as bad as it was when I first prescribed medication.  Almost daily described side effects lasting about 1-1/2 hours after use of the losartan/HCTZ.  Losartan can cause dizziness but not common side effect that I see.  In  light of your associated onset of signs/symptoms decided to prescribe the amlodipine 10 mg daily.  Will follow metabolic panel and decide on adding back diuretic provided no significant electrolyte abnormality. - EKG 12-Lead - Comp Met (CMET)  2. Palpitation,dizziness and head pressure associated with losartan hctz consistently. No current signs and symptoms presently except for minimal frontal sinus pressure.  The fluttering and dizziness not present as you have not taking daily BP medication today.  Frontal sinus region pressure might be related to elevated blood pressure.    Current neurologic exam normal.  EKG showed normal sinus rhythm.  Will see if your signs and symptoms stop with discontinuation of losartan. Decision on HCTZ pending blood work results.  EKG showed normal sinus rhythm.  If you have any persisting recurrent cardiac or neurologic signs symptoms despite medication changes then  recommend emergency department evaluation.    Might need to refer you to a cardiologist in the near future.  Will wait and decide after blood pressure review on follow-up.  - EKG 12-Lead(normal sinus rhythm) - Comp Met (CMET)  4. Anemia, unspecified type Recent moderate to heavy bleeding related to uterine fibroid.  Will assess labs to see if contributing factors to the above. - CBC w/Diff - Iron   Follow-up in 7 days or sooner if needed.

## 2022-11-04 LAB — COMPREHENSIVE METABOLIC PANEL
ALT: 6 U/L (ref 0–35)
AST: 10 U/L (ref 0–37)
Albumin: 3.7 g/dL (ref 3.5–5.2)
Alkaline Phosphatase: 100 U/L (ref 39–117)
BUN: 13 mg/dL (ref 6–23)
CO2: 24 mEq/L (ref 19–32)
Calcium: 9.4 mg/dL (ref 8.4–10.5)
Chloride: 98 mEq/L (ref 96–112)
Creatinine, Ser: 0.77 mg/dL (ref 0.40–1.20)
GFR: 84.06 mL/min (ref >60.00)
Glucose, Bld: 254 mg/dL — ABNORMAL HIGH (ref 70–99)
Potassium: 4.1 mEq/L (ref 3.5–5.1)
Sodium: 134 mEq/L — ABNORMAL LOW (ref 135–145)
Total Bilirubin: 0.3 mg/dL (ref 0.2–1.2)
Total Protein: 8.5 g/dL — ABNORMAL HIGH (ref 6.0–8.3)

## 2022-11-04 LAB — CBC WITH DIFFERENTIAL/PLATELET
Basophils Absolute: 0.2 10*3/uL — ABNORMAL HIGH (ref 0.0–0.1)
Basophils Relative: 1.1 % (ref 0.0–3.0)
Eosinophils Absolute: 0.4 10*3/uL (ref 0.0–0.7)
Eosinophils Relative: 2.4 % (ref 0.0–5.0)
HCT: 31.3 % — ABNORMAL LOW (ref 36.0–46.0)
Hemoglobin: 9 g/dL — ABNORMAL LOW (ref 12.0–15.0)
Lymphocytes Relative: 23.5 % (ref 12.0–46.0)
Lymphs Abs: 3.7 10*3/uL (ref 0.7–4.0)
MCHC: 28.9 g/dL — ABNORMAL LOW (ref 30.0–36.0)
MCV: 57.4 fl — ABNORMAL LOW (ref 78.0–100.0)
Monocytes Absolute: 0.8 10*3/uL (ref 0.1–1.0)
Monocytes Relative: 4.8 % (ref 3.0–12.0)
Neutro Abs: 10.8 10*3/uL — ABNORMAL HIGH (ref 1.4–7.7)
Neutrophils Relative %: 68.2 % (ref 43.0–77.0)
Platelets: 440 10*3/uL — ABNORMAL HIGH (ref 150.0–400.0)
RBC: 5.45 Mil/uL — ABNORMAL HIGH (ref 3.87–5.11)
RDW: 21.5 % — ABNORMAL HIGH (ref 11.5–15.5)
WBC: 15.8 10*3/uL — ABNORMAL HIGH (ref 4.0–10.5)

## 2022-11-04 LAB — IRON: Iron: 11 ug/dL — ABNORMAL LOW (ref 42–145)

## 2022-11-06 MED ORDER — AZITHROMYCIN 250 MG PO TABS: ORAL_TABLET | ORAL | 0 refills | Status: AC

## 2022-11-06 NOTE — Addendum Note (Signed)
Addended by: Anabel Halon on: 11/06/2022 06:11 AM   Modules accepted: Orders

## 2022-11-06 NOTE — Addendum Note (Signed)
Addended by: Anabel Halon on: 11/06/2022 06:08 AM   Modules accepted: Orders

## 2022-11-10 ENCOUNTER — Ambulatory Visit: Payer: BC Managed Care – PPO | Admitting: Medical

## 2022-11-10 ENCOUNTER — Encounter: Payer: Self-pay | Admitting: Medical

## 2022-11-10 VITALS — BP 159/80 | HR 100 | Resp 18 | Ht 65.0 in | Wt 364.0 lb

## 2022-11-10 DIAGNOSIS — I10 Essential (primary) hypertension: Secondary | ICD-10-CM | POA: Diagnosis not present

## 2022-11-10 DIAGNOSIS — D72829 Elevated white blood cell count, unspecified: Secondary | ICD-10-CM

## 2022-11-10 DIAGNOSIS — R002 Palpitations: Secondary | ICD-10-CM | POA: Diagnosis not present

## 2022-11-10 DIAGNOSIS — D649 Anemia, unspecified: Secondary | ICD-10-CM | POA: Diagnosis not present

## 2022-11-10 MED ORDER — LOSARTAN POTASSIUM 100 MG PO TABS: 100.0000 mg | ORAL_TABLET | Freq: Every day | ORAL | 3 refills | Status: DC

## 2022-11-10 MED ORDER — IRON (FERROUS SULFATE) 325 (65 FE) MG PO TABS: 325.0000 mg | ORAL_TABLET | Freq: Every day | ORAL | 1 refills | Status: AC

## 2022-11-10 NOTE — Patient Instructions (Addendum)
1. Hypertension, unspecified type Bp came down to 159/809slowly came down on repeat checks bp was 180/80 when I first checked). But originally was much higher as you just got call from hematologist/oncologist office. Continue amlodipine 10 mg daily. Add back on losartan 100 mg daily.but not the hctz. Dizziness did not correlate with stopping losartan. Normal neuro exam today.  2. Palpitation Very brief and occasional palpitation past 2 weeks(still present). Stop caffeine. Even tea. Will go ahead and place referral to cardiologist for possible zio patch.  3. Leukocytosis, unspecified type  CBC w/Diff.future to do next week. Call for appointment.  4. Anemia, unspecified type  Rx iron one tab daily. Upcoming gyn surgery for fibroid which is likely the underlying cause.  Diabetes- pt is on metformin and low sugar diet. Just started optavia diet. Please attend diabetic education. If sugars remain high or if optavia to expensive then could go ahead and try to rx ozempic.  Follow up 3 weeks or sooner if needed.

## 2022-11-10 NOTE — Progress Notes (Signed)
Subjective:    Patient ID: Hannah Figueroa, female    DOB: 25-May-1961, 62 y.o.   MRN: [id number]  HPI  1. Hypertension, unspecified type Blood pressure excessively high presently but not as bad as it was when I first prescribed medication.  Almost daily described side effects lasting about 1-1/2 hours after use of the losartan/HCTZ.  Losartan can cause dizziness but not common side effect that I see.  In light of your associated onset of signs/symptoms decided to prescribe the amlodipine 10 mg daily.  Will follow metabolic panel and decide on adding back diuretic provided no significant electrolyte abnormality. - EKG 12-Lead - Comp Met (CMET)   2. Palpitation,dizziness and head pressure associated with losartan hctz consistently. No current signs and symptoms presently except for minimal frontal sinus pressure.  The fluttering and dizziness not present as you have not taking daily BP medication today.  Frontal sinus region pressure might be related to elevated blood pressure.     Current neurologic exam normal.  EKG showed normal sinus rhythm.  Will see if your signs and symptoms stop with discontinuation of losartan. Decision on HCTZ pending blood work results.  EKG showed normal sinus rhythm.  If you have any persisting recurrent cardiac or neurologic signs symptoms despite medication changes then recommend emergency department evaluation.     Might need to refer you to a cardiologist in the near future.  Will wait and decide after blood pressure review on follow-up.   - EKG 12-Lead(normal sinus rhythm) - Comp Met (CMET)   4. Anemia, unspecified type Recent moderate to heavy bleeding related to uterine fibroid.  Will assess labs to see if contributing factors to the above. - CBC w/Diff - Iron"   Pt bp was high today. She just got called by hematologist/oncologist office and she state got extremely nervous. Pt has not checked bp since last visit. No neurologist or cardiac symptoms but  admits got very stressed when heard oncologist.   3 brief 2 second palpitation events since last visit.  Non sustained. No chest pain. No jaw pain.  Pt is scheduled for dilation and curretage/hysterosocpy end of may.   Pt sugars stil in 200 range. Not attended diabetic education yet. Is on metformin 500 mg twice daily.     Review of Systems  Constitutional:  Negative for chills, fatigue and fever.  HENT:  Negative for congestion and drooling.   Respiratory:  Negative for cough, chest tightness, shortness of breath and wheezing.   Cardiovascular:  Negative for chest pain and palpitations.  Gastrointestinal:  Negative for abdominal pain and blood in stool.  Musculoskeletal:  Negative for back pain.  Skin:  Negative for rash.  Neurological:  Negative for dizziness, numbness and headaches.  Hematological:  Negative for adenopathy. Does not bruise/bleed easily.  Psychiatric/Behavioral:  Negative for behavioral problems, confusion, decreased concentration, sleep disturbance and suicidal ideas. The patient is not nervous/anxious.    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  Tobacco Use   Smoking status: Former    Years: 20    Types: Cigarettes   Smokeless tobacco: Never  Vaping Use   Vaping Use: Never used  Substance and Sexual Activity   Alcohol use: No    Comment: soc   Drug use: No   Sexual activity: Yes    Birth control/protection: Surgical  Other Topics Concern   Not on file  Social History Narrative   Not on file   Social Determinants of Health   Financial Resource Strain: Not on file  Food Insecurity: Not on file  Transportation  Needs: Not on file  Physical Activity: Not on file  Stress: Not on file  Social Connections: Not on file  Intimate Partner Violence: Not on file    Past Surgical History:  Procedure Laterality Date   CESAREAN SECTION     CHOLECYSTECTOMY      Family History  Problem Relation Age of Onset   Hypertension Mother    Diabetes Mother    Asthma Father    Diabetes Father    Heart attack Father     Allergies  Allergen Reactions   Meloxicam    Penicillins     Current Outpatient Medications on File Prior to Visit  Medication Sig Dispense Refill   amLODipine (NORVASC) 10 MG tablet Take 1 tablet (10 mg total) by mouth daily. 30 tablet 11   atorvastatin (LIPITOR) 10 MG tablet Take 1 tablet (10 mg total) by mouth daily. 90 tablet 3   azithromycin (ZITHROMAX) 250 MG tablet Take 2 tablets on day 1, then 1 tablet daily on days 2 through 5 6 tablet 0   Blood Glucose Monitoring Suppl (ACCU-CHEK AVIVA PLUS) w/Device KIT Check blood sugar BID  one time early morning fasting and second time after lunch or dinner. 1 kit 0   glucose blood (ACCU-CHEK AVIVA PLUS) test strip Check blood sugar BID  one time early morning fasting and second time after lunch or dinner. 100 each 12   Lancets (ACCU-CHEK MULTICLIX) lancets Check blood sugar BID  one time early morning fasting and second time after lunch or dinner. 100 each 12   megestrol (MEGACE) 40 MG tablet Take 1 tablet (40 mg total) by mouth 2 (two) times daily. Can increase to two tablets twice a day in the event of heavy bleeding 60 tablet 3   metFORMIN (GLUCOPHAGE) 500 MG tablet Take 1 tablet (500 mg total) by mouth 2 (two) times daily with a meal. 60 tablet 3   No current facility-administered medications on file prior to visit.    Pulse 100   Resp 18   Ht 5\' 5"  (1.651 m)   Wt (!) 364 lb (165.1 kg)   SpO2 100%   BMI 60.57 kg/m        Objective:   Physical Exam  General Mental Status- Alert. General Appearance- Not in acute distress.    Skin General: Color- Normal Color. Moisture- Normal Moisture.  Neck Carotid Arteries- Normal color. Moisture- Normal Moisture. No carotid bruits. No JVD.  Chest and Lung Exam Auscultation: Breath Sounds:-Normal.  Cardiovascular Auscultation:Rythm- Regular. Murmurs & Other Heart Sounds:Auscultation of the heart reveals- No Murmurs.  Abdomen Inspection:-Inspeection Normal. Palpation/Percussion:Note:No mass. Palpation and Percussion of the abdomen reveal- Non Tender, Non Distended + BS, no rebound or guarding.  Neurologic Cranial Nerve exam:- CN III-XII intact(No nystagmus), symmetric smile. Strength:- 5/5 equal and symmetric strength both upper and lower extremities.       Assessment & Plan:  1. Hypertension, unspecified type Bp came down to 159/80. But originally was much higher as you just got call from hematologist/oncologist office. Continue  amlodipine 10 mg daily. Add back on losartan 100 mg daily.but not the hctz. Dizziness did not correlate with stopping losartan.  2. Palpitation Very brief and occasional palpitation past 2 weeks(still present). Stop caffeine. Even tea. Will go ahead and place referral to cardiologist for possible zio patch.  3. Leukocytosis, unspecified type  CBC w/Diff.future to do next week. Call for appointment.  4. Anemia, unspecified type  Rx iron one tab daily. Upcoming gyn surgery for fibroid which is likely the underlying cause.  Diabetes- pt is on metformin and low sugar diet. Just started optavia diet. Please attend diabetic education. If sugars remain high or if optavia to expensive then could go ahead and try to rx ozempic.  Follow up 3 weeks or sooner if needed.   Time spent with patient today was  47 minutes which consisted of chart review, discussing diagnosis, work up treatment and documentation.

## 2022-11-11 ENCOUNTER — Ambulatory Visit (HOSPITAL_BASED_OUTPATIENT_CLINIC_OR_DEPARTMENT_OTHER): Payer: BC Managed Care – PPO

## 2022-11-12 ENCOUNTER — Encounter: Payer: Self-pay | Admitting: Medical

## 2022-11-13 ENCOUNTER — Other Ambulatory Visit: Payer: Self-pay

## 2022-11-13 DIAGNOSIS — R9389 Abnormal findings on diagnostic imaging of other specified body structures: Secondary | ICD-10-CM

## 2022-11-13 DIAGNOSIS — N939 Abnormal uterine and vaginal bleeding, unspecified: Secondary | ICD-10-CM

## 2022-11-13 MED ORDER — MEGESTROL ACETATE 40 MG PO TABS: 40.0000 mg | ORAL_TABLET | Freq: Two times a day (BID) | ORAL | 3 refills | Status: DC

## 2022-11-13 NOTE — Progress Notes (Signed)
Patient called requesting a new Rx for Megace. Patient states she lost her medication. A new Rx for Megace 40 mg 1 tablet PO BID x 3 refills. Can increase to two tablets BID in the event of heavy bleeding. Was sent to her pharmacy. Understanding was voiced.  Lynley Killilea l Isom Kochan, CMA

## 2022-11-18 ENCOUNTER — Encounter: Payer: Self-pay | Admitting: Medical Oncology

## 2022-11-18 ENCOUNTER — Inpatient Hospital Stay: Payer: BC Managed Care – PPO | Admitting: Medical Oncology

## 2022-11-18 ENCOUNTER — Inpatient Hospital Stay: Payer: BC Managed Care – PPO | Attending: Hematology & Oncology

## 2022-11-18 VITALS — BP 134/69 | HR 99 | Temp 98.3°F | Resp 18 | Ht 65.0 in | Wt 351.1 lb

## 2022-11-18 DIAGNOSIS — Z7984 Long term (current) use of oral hypoglycemic drugs: Secondary | ICD-10-CM | POA: Diagnosis not present

## 2022-11-18 DIAGNOSIS — G4730 Sleep apnea, unspecified: Secondary | ICD-10-CM | POA: Diagnosis not present

## 2022-11-18 DIAGNOSIS — Z87891 Personal history of nicotine dependence: Secondary | ICD-10-CM | POA: Diagnosis not present

## 2022-11-18 DIAGNOSIS — Z79899 Other long term (current) drug therapy: Secondary | ICD-10-CM | POA: Insufficient documentation

## 2022-11-18 DIAGNOSIS — D509 Iron deficiency anemia, unspecified: Secondary | ICD-10-CM | POA: Diagnosis not present

## 2022-11-18 DIAGNOSIS — E669 Obesity, unspecified: Secondary | ICD-10-CM | POA: Insufficient documentation

## 2022-11-18 DIAGNOSIS — K219 Gastro-esophageal reflux disease without esophagitis: Secondary | ICD-10-CM | POA: Diagnosis not present

## 2022-11-18 DIAGNOSIS — E119 Type 2 diabetes mellitus without complications: Secondary | ICD-10-CM | POA: Insufficient documentation

## 2022-11-18 DIAGNOSIS — D500 Iron deficiency anemia secondary to blood loss (chronic): Secondary | ICD-10-CM | POA: Diagnosis not present

## 2022-11-18 DIAGNOSIS — D72829 Elevated white blood cell count, unspecified: Secondary | ICD-10-CM | POA: Diagnosis not present

## 2022-11-18 DIAGNOSIS — M1990 Unspecified osteoarthritis, unspecified site: Secondary | ICD-10-CM | POA: Insufficient documentation

## 2022-11-18 LAB — RETICULOCYTES
Immature Retic Fract: 35.6 % — ABNORMAL HIGH (ref 2.3–15.9)
RBC.: 5.22 MIL/uL — ABNORMAL HIGH (ref 3.87–5.11)
Retic Count, Absolute: 128.9 10*3/uL (ref 19.0–186.0)
Retic Ct Pct: 2.5 % (ref 0.4–3.1)

## 2022-11-18 LAB — CBC WITH DIFFERENTIAL (CANCER CENTER ONLY)
Abs Immature Granulocytes: 0.07 10*3/uL (ref 0.00–0.07)
Basophils Absolute: 0 10*3/uL (ref 0.0–0.1)
Basophils Relative: 0 %
Eosinophils Absolute: 0.3 10*3/uL (ref 0.0–0.5)
Eosinophils Relative: 3 %
HCT: 32.9 % — ABNORMAL LOW (ref 36.0–46.0)
Hemoglobin: 8.5 g/dL — ABNORMAL LOW (ref 12.0–15.0)
Immature Granulocytes: 1 %
Lymphocytes Relative: 25 %
Lymphs Abs: 3 10*3/uL (ref 0.7–4.0)
MCH: 15.9 pg — ABNORMAL LOW (ref 26.0–34.0)
MCHC: 25.8 g/dL — ABNORMAL LOW (ref 30.0–36.0)
MCV: 61.6 fL — ABNORMAL LOW (ref 80.0–100.0)
Monocytes Absolute: 0.7 10*3/uL (ref 0.1–1.0)
Monocytes Relative: 6 %
Neutro Abs: 8 10*3/uL — ABNORMAL HIGH (ref 1.7–7.7)
Neutrophils Relative %: 65 %
Platelet Count: 462 10*3/uL — ABNORMAL HIGH (ref 150–400)
RBC: 5.34 MIL/uL — ABNORMAL HIGH (ref 3.87–5.11)
RDW: 23.1 % — ABNORMAL HIGH (ref 11.5–15.5)
Smear Review: NORMAL
WBC Count: 12.1 10*3/uL — ABNORMAL HIGH (ref 4.0–10.5)
nRBC: 0 % (ref 0.0–0.2)

## 2022-11-18 LAB — CMP (CANCER CENTER ONLY)
ALT: 11 U/L (ref 0–44)
AST: 14 U/L — ABNORMAL LOW (ref 15–41)
Albumin: 3.6 g/dL (ref 3.5–5.0)
Alkaline Phosphatase: 81 U/L (ref 38–126)
Anion gap: 8 (ref 5–15)
BUN: 13 mg/dL (ref 6–20)
CO2: 26 mmol/L (ref 22–32)
Calcium: 9.3 mg/dL (ref 8.9–10.3)
Chloride: 101 mmol/L (ref 98–111)
Creatinine: 0.71 mg/dL (ref 0.44–1.00)
GFR, Estimated: >60 mL/min (ref >60)
Glucose, Bld: 190 mg/dL — ABNORMAL HIGH (ref 70–99)
Potassium: 4.1 mmol/L (ref 3.5–5.1)
Sodium: 135 mmol/L (ref 135–145)
Total Bilirubin: 0.5 mg/dL (ref 0.3–1.2)
Total Protein: 8.5 g/dL — ABNORMAL HIGH (ref 6.5–8.1)

## 2022-11-18 LAB — LACTATE DEHYDROGENASE: LDH: 122 U/L (ref 98–192)

## 2022-11-18 LAB — C-REACTIVE PROTEIN: CRP: 4.9 mg/dL — ABNORMAL HIGH (ref <1.0)

## 2022-11-18 LAB — SEDIMENTATION RATE: Sed Rate: 40 mm/hr — ABNORMAL HIGH (ref 0–22)

## 2022-11-18 NOTE — Progress Notes (Signed)
[place] [phone]   [phone]  INITIAL CONSULT NOTE  Patient Care Team: Elise Benne as PCP - General (Internal Medicine)  Hematological/Oncological History # Iron Deficiency Anemia- secondary to uterine fibroids.   CHIEF COMPLAINTS/PURPOSE OF CONSULTATION:  "Blood counts "  HISTORY OF PRESENTING ILLNESS:  Hannah Figueroa 62 y.o. female new patient with medical history significant for OSA, Obesity, anemia, heavy menses, GERD, osteoarthritis of knees presents as a referral for leukocytosis:   Labs from her PCP on 11/03/2022 show a WBC of 15.8, ANC of 10.8, Basophil count of 0.2. Upon review she has had mild elevations in her WBC counts for quite some time. The oldest labs in our system show that her WBC on 08/26/2009 was elevated at 12.6- differential not performed.   Smoking: Former as of 20 years ago Inflammatory conditions: No history of RA, Kawasaki, Crohn's, Ulcerative colitis, chronic hepatitis, Sweet syndrome: No Recent Infections: No COVID-19: Twice that she knows. Last was October 2023.  Night Sweats: Yes- feels that she may be going through menopause. Unsure when mother went through menopause.  Unintentional weight loss: No - now on Optivia  Steroid Use/ Bone marrow stimulators/ Catecholamines, Lithium, Plerixafor: No. Very recently started Megace Spleen Injury: No Obesity: Yes Thyroid Disease: No Extreme Physical or Emotional Stress: Chronic stress Pregnancy: No Family History of Neutrophilia: No  In terms of her anemia she has iron deficiency anemia thought to be secondary to heavy menses. Hysteroscopy with D & C with possible ablation is scheduled for the 24th of this month. She has had a colonoscopy prior and fecal occult testing which were non concerning. She is due for a colonoscopy but is waiting until after her hysteroscopy to performed. She has recently started OTC iron supplementation as of Thursday which she is  tolerating well.   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  Tobacco Use   Smoking status: Former    Years: 20    Types: Cigarettes   Smokeless tobacco: Never  Vaping Use   Vaping Use: Never used  Substance and Sexual Activity   Alcohol use: No    Comment: soc   Drug use: No   Sexual activity: Yes    Birth control/protection: Surgical  Other Topics Concern   Not on file  Social History Narrative   Not on file   Social Determinants of Health   Financial Resource Strain: Not on file  Food Insecurity: No Food Insecurity (11/18/2022)   Hunger Vital Sign    Worried About Running Out of Food in the Last Year: Never true    Ran Out of Food in the Last Year: Never true  Transportation Needs: No Transportation Needs (11/18/2022)   PRAPARE - Hydrologist (Medical): No    Lack of Transportation (Non-Medical): No  Physical Activity: Not on file  Stress: Not on file  Social Connections: Not on file  Intimate Partner Violence: Not At Risk (11/18/2022)   Humiliation, Afraid, Rape, and Kick questionnaire    Fear of Current or Ex-Partner: No    Emotionally Abused: No    Physically Abused: No    Sexually Abused: No    FAMILY HISTORY: Family History  Problem Relation Age of Onset   Hypertension Mother    Diabetes Mother    Asthma Father    Diabetes Father    Heart attack Father     ALLERGIES:  is allergic to meloxicam and  penicillins.  MEDICATIONS:  Current Outpatient Medications  Medication Sig Dispense Refill   amLODipine (NORVASC) 10 MG tablet Take 1 tablet (10 mg total) by mouth daily. 30 tablet 11   atorvastatin (LIPITOR) 10 MG tablet Take 1 tablet (10 mg total) by mouth daily. 90 tablet 3   Blood Glucose Monitoring Suppl (ACCU-CHEK AVIVA PLUS) w/Device KIT Check blood sugar BID  one time early morning fasting and second time after lunch or dinner. 1 kit 0   glucose blood (ACCU-CHEK AVIVA PLUS) test strip Check blood sugar BID  one time early morning fasting and second time after lunch or dinner. 100 each 12   Iron, Ferrous Sulfate, 325 (65 Fe) MG TABS Take 325 mg by mouth daily. 30 tablet 1   Lancets (ACCU-CHEK MULTICLIX) lancets Check blood sugar BID  one time early morning fasting and second time after lunch or dinner. 100 each 12   losartan (COZAAR) 100 MG tablet Take 1 tablet (100 mg total) by mouth daily. 90 tablet 3   megestrol (MEGACE) 40 MG tablet Take 1 tablet (40 mg total) by mouth 2 (two) times daily. Can increase to two tablets twice a day in the event of heavy bleeding 60 tablet 3   metFORMIN (GLUCOPHAGE) 500 MG tablet Take 1 tablet (500 mg total) by mouth 2 (two) times daily with a meal. 60 tablet 3   No current facility-administered medications for this visit.    REVIEW OF SYSTEMS:   Constitutional: ( - ) fevers, ( - )  chills , ( - ) night sweats Eyes: ( - ) blurriness of vision, ( - ) double vision, ( - ) watery eyes Ears, nose, mouth, throat, and face: ( - ) mucositis, ( - ) sore throat Respiratory: ( - ) cough, ( - ) dyspnea, ( - ) wheezes Cardiovascular: ( - ) palpitation, ( - ) chest discomfort, ( - ) lower extremity swelling Gastrointestinal:  ( - ) nausea, ( - ) heartburn, ( - ) change in bowel habits Skin: ( - ) abnormal skin rashes Lymphatics: ( - ) new lymphadenopathy, ( - ) easy bruising Neurological: ( - ) numbness, ( - ) tingling, ( - ) new weaknesses Behavioral/Psych: (  - ) mood change, ( - ) new changes  All other systems were reviewed with the patient and are negative.  PHYSICAL EXAMINATION: ECOG PERFORMANCE STATUS: 1 - Symptomatic but completely ambulatory  Vitals:   11/18/22 1323 11/18/22 1329  BP: (!) 170/74 134/69  Pulse: (!) 106 99  Resp: 18   Temp: 98.3 F (36.8 C)   SpO2: 100%    Filed Weights   11/18/22 1323  Weight: (!) 351 lb 1.9 oz (159.3 kg)    GENERAL: well appearing women in NAD - morbid obesity  SKIN: skin color, texture, turgor are normal, no rashes or significant lesions EYES: conjunctiva are pink and non-injected, sclera clear OROPHARYNX: no exudate, no erythema;  lips, and tongue normal -buccal mucous with mild pallor  NECK: supple, non-tender LYMPH:  no palpable lymphadenopathy in the cervical, axillary or supraclavicular lymph nodes.  LUNGS: clear to auscultation and percussion with normal breathing effort HEART: regular rate & rhythm and no murmurs and no lower extremity edema ABDOMEN: soft, non-tender, non-distended, normal bowel sounds, no splenomegaly palpable  Musculoskeletal: no cyanosis of digits and no clubbing  PSYCH: alert & oriented x 3, fluent speech NEURO: no focal motor/sensory deficits  LABORATORY DATA:  I have reviewed the data as listed    Latest Ref Rng & Units 11/03/2022    4:05 PM 05/13/2019   12:57 PM 05/05/2019    9:11 AM  CBC  WBC 4.0 - 10.5 K/uL 15.8  11.3  12.6   Hemoglobin 12.0 - 15.0 g/dL 9.0  9.2 Repeated and verified X2.  9.2   Hematocrit 36.0 - 46.0 % 31.3  32.4  31.7   Platelets 150.0 - 400.0 K/uL 440.0  399.0  386.0        Latest Ref Rng & Units 11/03/2022    4:05 PM 09/30/2022   12:16 PM 05/05/2019    9:11 AM  CMP  Glucose 70 - 99 mg/dL 254  294  213   BUN 6 - 23 mg/dL 13  11  11    Creatinine 0.40 - 1.20 mg/dL 0.77  0.67  0.73   Sodium 135 - 145 mEq/L 134  135  138   Potassium 3.5 - 5.1 mEq/L 4.1  4.6  4.4   Chloride 96 - 112 mEq/L 98  100  103   CO2 19 - 32 mEq/L 24  27  29     Calcium 8.4 - 10.5 mg/dL 9.4  9.1  9.1   Total Protein 6.0 - 8.3 g/dL 8.5  7.6  7.3   Total Bilirubin 0.2 - 1.2 mg/dL 0.3  0.3  0.3   Alkaline Phos 39 - 117 U/L 100  94  92   AST 0 - 37 U/L 10  7  9    ALT 0 - 35 U/L 6  6  7      ASSESSMENT & PLAN  Today we reviewed that Lymphocytosis is an elevation in the lymphocyte cells in the blood. This may indicate several possible conditions including hematological malignancies, transient elevation from infection, or response to inflammation. The workup for lymphocytosis consists of determining if the lymphocytes are malignant in nature. The best test to determine this is flow cytometry, which can help determine if the lymphocytes are a monoclonal population.  Additional workup includes inflammatory workup and detailed history to assure no infectious symptoms. At this time we are going to start with a generalized work up. If Flow cytometry is abnormal I would suggest a bone marrow biopsy which we discussed. Additionally if flow is normal she would benefit from HIV, Hepatitis, testing at follow up. In the meantime she will continue her follow up with GYN and continues her iron supplementation. We discussed that they best way to take oral iron is in the morning with breakfast and orange juice. She can bump up to twice daily if tolerable.   #Lymphocytosis --labs to include CBC, CMP, LDH, and flow cytometry --additional studies to include ESR and CRP  --no evidence of lymphadenopathy on physical exam. Patient has no history of splenectomy.  --RTC in 5-7 weeks.   Orders Placed This Encounter  Procedures   CBC with Differential ([facility] Only)    Standing Status:   Future  Number of Occurrences:   1    Standing Expiration Date:   11/18/2023   Flow Cytometry, Peripheral Blood (Oncology)    Standing Status:   Standing    Number of Occurrences:   1   Technologist smear review    Order Specific Question:   Clinical information:    Answer:   chronic  and worsening leukocytosis, elevated platelet count, anemia   Sedimentation rate    Standing Status:   Future    Number of Occurrences:   1    Standing Expiration Date:   11/18/2023   C-reactive protein    Standing Status:   Future    Number of Occurrences:   1    Standing Expiration Date:   11/18/2023   CMP ([facility] only)    Standing Status:   Future    Number of Occurrences:   1    Standing Expiration Date:   11/18/2023   Reticulocytes    Standing Status:   Future    Number of Occurrences:   1    Standing Expiration Date:   11/18/2023   Lactate dehydrogenase (LDH)    Standing Status:   Future    Number of Occurrences:   1    Standing Expiration Date:   11/18/2023   BCR ABL1 FISH ([facility])    Standing Status:   Future    Number of Occurrences:   1    Standing Expiration Date:   11/18/2023    All questions were answered. The patient knows to call the clinic with any problems, questions or concerns.  I have spent a total of 45 minutes minutes of face-to-face and non-face-to-face time, preparing to see the patient, obtaining and/or reviewing separately obtained history, performing a medically appropriate examination, counseling and educating the patient, ordering medications/tests/procedures, referring and communicating with other health care professionals, documenting clinical information in the electronic health record, independently interpreting results and communicating results to the patient, and care coordination.    Nelwyn Salisbury PA-C Department of Hematology/Oncology [facility] at [facility]

## 2022-11-19 LAB — SURGICAL PATHOLOGY

## 2022-11-20 LAB — FLOW CYTOMETRY

## 2022-11-28 LAB — BCR ABL1 FISH (GENPATH)

## 2022-12-02 ENCOUNTER — Telehealth: Payer: Self-pay | Admitting: *Deleted

## 2022-12-02 ENCOUNTER — Telehealth (HOSPITAL_BASED_OUTPATIENT_CLINIC_OR_DEPARTMENT_OTHER): Payer: Self-pay

## 2022-12-02 NOTE — Telephone Encounter (Signed)
Per 11/18/22 los - Called patient and lvm of upcoming appointment - requested call back to  confirm

## 2022-12-09 ENCOUNTER — Encounter (HOSPITAL_COMMUNITY): Payer: Self-pay | Admitting: Obstetrics & Gynecology

## 2022-12-09 ENCOUNTER — Other Ambulatory Visit: Payer: Self-pay

## 2022-12-09 NOTE — Progress Notes (Signed)
Anesthesia Chart Review: Hannah Figueroa  Case: [id number] Date/Time: 12/10/22 0700   Procedure: DILATATION & CURETTAGE/HYSTEROSCOPY WITH NOVASURE ABLATION - rep will be here confirmed on 4/15 CS   Anesthesia type: Choice   Pre-op diagnosis:      AUB     Thickened Endometrium   Location: [facility] ROOM 07 / [facility] OR   Surgeons: Willodean Rosenthal, MD       DISCUSSION: Patient is a 62 year old female scheduled for the above procedure.  History includes former smoker, DM2, asthma, GERD, anemia, cholecystomy, obesity.   She had hematology evaluation by Clent Jacks, PA-C on 11/18/22 for mild leukocytosis and anemia. IDA is felt secondary to menorrhagia. She reported plans for colonoscopy following hysteroscopy. In regards to lymphocytosis, Flow cytometry ordered and if abnormal would consider bone marrow biopsy. Flow cytometry normal on 11/18/22, so suspected her elevations were inflammatory in nature. Two week follow-up planned, and also encouraged her to share lab results with her rheumatologist.  She was evaluated by Dr. Wynona Neat on 2/09/26/22 for evaluation of OSA. Previously diagnosis, but she never started CPAP. Lab split-night study ordered. 3-4 month follow-up planned.   Last follow-up with PCP Saguier, Ramon Dredge, PA-C was on 11/10/22. She was diagnosed with HTN and DM at 09/30/22 visit. A1c had been 10.5% and started on metformin 500 mg BID. BP 170/98, and was started on losartan-HCTZ 100-12.5 mg daily. She was referred for sleep study as well. At 11/03/22, she reported "heart flutters, dizziness and occasional head pressure" about 1.5 hours after taking losartan-HCTZ. BP 165/85. She did not notice those symptoms on days she missed a dose. Since BP still elevated, he added amlodipine 10 mg daily and had her hold losartan. CBGs ~ 150-200 since starting metformin. At 11/10/22 follow-up, BP 159/80. Dizziness did not correlate with stopping losartan, so added back but not the HCTZ. She had very brief,  occasional palpitations x 2 weeks, so advised her to stop caffeine and referred her to cardiology for consideration of a Ziopatch monitor which has not been scheduled yet. She was not having chest or jaw pain. He noted plans for future D&C/hysteroscopy for moderate-severe vaginal bleeding.  She had labs through [facility] on 11/18/22 including a CBC and CMP. H/H 8.5/32.9, down from 9.0/31.3 on 11/03/22. PLT 462. WBC 12.1. Glucose 190. A1c 10.5% on 09/30/22, and she was started on metformin and referred to diabetes educator. At follow-up, Saguier, Kateri Mc may consider adding Ozempic. Dr. Erin Fulling noted recent diagnosis of diabetes and HTN with initiation of treatments. She is on ferrous sulfate and had been on Megace.    As above, very brief palpitations and dizziness when she started anti-hypertensive medication. She reported that palpitations improved after beginning iron. Occasional dizziness if she stands up too quickly. Head pressure is now rare as well. She lost her home BP cuff, so has not been able to monitor her BP. Last reading 134/69. She said home CBGs have been running ~ 108-123.  She also reported recent 19 lb weight loss on the [place] Nutrition Plan.   She was being considered for Ziopatch monitor, but she never scheduled with cardiology. Palpitations now improved. Discussed with anesthesiologist Lewie Loron, MD. Anesthesia team to evaluate on the day of surgery.   VS:  BP Readings from Last 3 Encounters:  11/18/22 134/69  11/10/22 (!) 159/80  11/03/22 (!) 165/85   Pulse Readings from Last 3 Encounters:  11/18/22 99  11/10/22 100  11/03/22 90  WT on 11/18/22: 159.3 kg  PROVIDERS: Saguier, Edward, PA-C is PCP  Virl Diamond, MD is pulmonologist (for OSAl)   LABS: For day [facility]i Mcbs [facility] include: Lab Results  Component Value Date   WBC 12.1 (H) 11/18/2022   HGB 8.5 (L) 11/18/2022   HCT 32.9 (L) 11/18/2022   PLT 462 (H) 11/18/2022   GLUCOSE 190 (H)  11/18/2022   CHOL 123 09/30/2022   TRIG 73.0 09/30/2022   HDL 39.70 09/30/2022   LDLCALC 69 09/30/2022   ALT 11 11/18/2022   AST 14 (L) 11/18/2022   NA 135 11/18/2022   K 4.1 11/18/2022   CL 101 11/18/2022   CREATININE 0.71 11/18/2022   BUN 13 11/18/2022   CO2 26 11/18/2022   HGBA1C 10.5 (H) 09/30/2022     IMAGES: US Pelvic 10/08/22: MPRESSION: - Nonvisualization of ovaries. - Suboptimal visualization of the uterus due to body habitus and bowel. - 5.0 cm diameter hypoechoic soft tissue mass anterior to RIGHT of the uterus, could potentially represent an exophytic leiomyoma though this is incompletely characterized and not of confirmed uterine origin; this could be further characterized/localized by MR imaging or less optimally CT. - Thickened endometrial complex 13 mm thick; In the setting of post-menopausal bleeding, endometrial sampling is indicated to exclude carcinoma. If results are benign, sonohysterogram should be considered for focal lesion work-up. (Ref: Radiological Reasoning: Algorithmic Workup of Abnormal Vaginal Bleeding with Endovaginal Sonography and Sonohysterography. AJR 2008; 657:Q46-96) - Endometrial biopsy 10/08/22:   Benign endometrial polyp showing hyperplasia without any atypia. Negative for malignancy    EKG: EKG 11/03/22:  Sinus rhythm. Negative T wave in III, non-specific in aVF.  EKG 05/28/18: NSR.    CV: N/A  Past Medical History:  Diagnosis Date   Anemia    Asthma 11/15/2015   Diabetes mellitus without complication    Esophageal reflux 11/15/2015   Hypertension    Pneumonia     Past Surgical History:  Procedure Laterality Date   CESAREAN SECTION     CHOLECYSTECTOMY      MEDICATIONS: No current facility-administered medications for this encounter.    amLODipine (NORVASC) 10 MG tablet   Iron, Ferrous Sulfate, 325 (65 Fe) MG TABS   losartan (COZAAR) 100 MG tablet   melatonin 3 MG TABS tablet   metFORMIN (GLUCOPHAGE) 500 MG  tablet   atorvastatin (LIPITOR) 10 MG tablet   Blood Glucose Monitoring Suppl (ACCU-CHEK AVIVA PLUS) w/Device KIT   glucose blood (ACCU-CHEK AVIVA PLUS) test strip   Lancets (ACCU-CHEK MULTICLIX) lancets   megestrol (MEGACE) 40 MG tablet    Shonna Chock, PA-C Surgical Short Stay/Anesthesiology [facility] Phone [phone] [facility] Phone [phone] 12/09/2022 3:41 PM

## 2022-12-09 NOTE — Progress Notes (Addendum)
Mrs. Hannah Figueroa denies chest pain or shortness of health. Patient denies having any s/s of Covid in her household, also denies any known exposure to Covid. Mrs. Hannah Figueroa denies   any s/s of upper or lower respiratory in the past 8 weeks.   Mrs. Hannah Figueroa's PCP is [employer] PA-C. Patient is being seen by Clent Jacks, PA-C  at [facility], patient is taking iron since her appointment there, on 11/18/22 .Mrs. Hannah Figueroa reports improvement, since beginning Iron: "palpations- none in 1-2 weeks, only lasting 3-4 seconds, dizziness comes in bouts, usually when I stand up too quickly, head pressure is very rare."  Mrs. Hannah Figueroa has lost blood pressure equipment, she has not checked blood pressure since before her PCP visit in March.  Mrs. Hannah Figueroa has type Ii diabetes, patient checks CBG at least 1 time a day- CBGs  run 108- 123. I instructed patient to hold Metformin in AM. I instructed Mrs. Hannah Figueroa to check CBG after awaking and every 2 hours until arrival  to the hospital. I Instructed patient if CBG is less than 70 to take 4 Glucose Tablets or 1 tube of Glucose Gel or 1/2 cup of a clear juice. Recheck CBG in 15 minutes if CBG is not over 70 call, pre- op desk at [phone] for further instructions. If scheduled to receive Insulin, do not take Insulin.  Mrs stated that she has lost 19 lbs after starting on the [place] nutrition plan.

## 2022-12-09 NOTE — Anesthesia Preprocedure Evaluation (Addendum)
Anesthesia Evaluation  Patient identified by MRN, date of birth, ID band Patient awake    Reviewed: Allergy & Precautions, NPO status , Patient's Chart, lab work & pertinent test results  Airway Mallampati: II  TM Distance: >3 FB Neck ROM: Full    Dental no notable dental hx. (+) Teeth Intact, Dental Advisory Given   Pulmonary sleep apnea and Continuous Positive Airway Pressure Ventilation , former smoker   Pulmonary exam normal breath sounds clear to auscultation       Cardiovascular hypertension, Pt. on medications Normal cardiovascular exam Rhythm:Regular Rate:Normal     Neuro/Psych    GI/Hepatic   Endo/Other  diabetes, Well Controlled, Type 2  Morbid obesity (BMI 57.41)  Renal/GU Lab Results      Component                Value               Date                      CREATININE               0.71                11/18/2022                BUN                      13                  11/18/2022                NA                       135                 11/18/2022                K                        4.1                 11/18/2022                CL                       101                 11/18/2022                CO2                      26                  11/18/2022                Musculoskeletal  (+) Arthritis ,    Abdominal  (+) + obese  Peds  Hematology  (+) Blood dyscrasia, anemia Lab Results      Component                Value               Date                      WBC  12.1 (H)            11/18/2022                HGB                      8.5 (L)             11/18/2022                HCT                      32.9 (L)            11/18/2022                MCV                      61.6 (L)            11/18/2022                PLT                      462 (H)             11/18/2022              Anesthesia Other Findings All: meloxicam and PCN  Reproductive/Obstetrics                              Anesthesia Physical Anesthesia Plan  ASA: 4  Anesthesia Plan: General   Post-op Pain Management: Ofirmev IV (intra-op)* and Precedex   Induction: Intravenous  PONV Risk Score and Plan: Midazolam, Treatment may vary due to age or medical condition, Ondansetron and Dexamethasone  Airway Management Planned: Oral ETT and LMA  Additional Equipment: None  Intra-op Plan:   Post-operative Plan: Extubation in OR  Informed Consent: I have reviewed the patients History and Physical, chart, labs and discussed the procedure including the risks, benefits and alternatives for the proposed anesthesia with the patient or authorized representative who has indicated his/her understanding and acceptance.     Dental advisory given  Plan Discussed with: CRNA, Anesthesiologist and Surgeon  Anesthesia Plan Comments: (PAT note written 12/09/2022 by Shonna Chock, PA-C.  GA w  proseal LMA vs ETT  )       Anesthesia Quick Evaluation

## 2022-12-10 ENCOUNTER — Ambulatory Visit (HOSPITAL_COMMUNITY): Payer: BC Managed Care – PPO | Admitting: Vascular Surgery

## 2022-12-10 ENCOUNTER — Encounter (HOSPITAL_COMMUNITY): Payer: Self-pay | Admitting: Obstetrics & Gynecology

## 2022-12-10 ENCOUNTER — Other Ambulatory Visit: Payer: Self-pay

## 2022-12-10 ENCOUNTER — Ambulatory Visit (HOSPITAL_COMMUNITY)
Admission: RE | Admit: 2022-12-10 | Discharge: 2022-12-10 | Disposition: A | Discharge status: Home / Self Care | Payer: BC Managed Care – PPO | Attending: Obstetrics & Gynecology | Admitting: Obstetrics & Gynecology

## 2022-12-10 ENCOUNTER — Encounter (HOSPITAL_COMMUNITY)
Admission: RE | Disposition: A | Discharge status: Home / Self Care | Payer: Self-pay | Source: Home / Self Care | Attending: Obstetrics & Gynecology

## 2022-12-10 DIAGNOSIS — N840 Polyp of corpus uteri: Secondary | ICD-10-CM | POA: Diagnosis not present

## 2022-12-10 DIAGNOSIS — Z87891 Personal history of nicotine dependence: Secondary | ICD-10-CM | POA: Insufficient documentation

## 2022-12-10 DIAGNOSIS — N938 Other specified abnormal uterine and vaginal bleeding: Secondary | ICD-10-CM | POA: Insufficient documentation

## 2022-12-10 DIAGNOSIS — D649 Anemia, unspecified: Secondary | ICD-10-CM | POA: Diagnosis not present

## 2022-12-10 DIAGNOSIS — N8502 Endometrial intraepithelial neoplasia [EIN]: Secondary | ICD-10-CM | POA: Insufficient documentation

## 2022-12-10 DIAGNOSIS — R9389 Abnormal findings on diagnostic imaging of other specified body structures: Secondary | ICD-10-CM | POA: Insufficient documentation

## 2022-12-10 DIAGNOSIS — I10 Essential (primary) hypertension: Secondary | ICD-10-CM | POA: Diagnosis not present

## 2022-12-10 DIAGNOSIS — Z833 Family history of diabetes mellitus: Secondary | ICD-10-CM | POA: Insufficient documentation

## 2022-12-10 DIAGNOSIS — G8929 Other chronic pain: Secondary | ICD-10-CM | POA: Diagnosis not present

## 2022-12-10 DIAGNOSIS — M1990 Unspecified osteoarthritis, unspecified site: Secondary | ICD-10-CM | POA: Diagnosis not present

## 2022-12-10 DIAGNOSIS — K219 Gastro-esophageal reflux disease without esophagitis: Secondary | ICD-10-CM | POA: Diagnosis not present

## 2022-12-10 DIAGNOSIS — N950 Postmenopausal bleeding: Secondary | ICD-10-CM | POA: Insufficient documentation

## 2022-12-10 DIAGNOSIS — E119 Type 2 diabetes mellitus without complications: Secondary | ICD-10-CM | POA: Insufficient documentation

## 2022-12-10 DIAGNOSIS — Z7984 Long term (current) use of oral hypoglycemic drugs: Secondary | ICD-10-CM | POA: Diagnosis not present

## 2022-12-10 DIAGNOSIS — G4733 Obstructive sleep apnea (adult) (pediatric): Secondary | ICD-10-CM | POA: Insufficient documentation

## 2022-12-10 DIAGNOSIS — Z6841 Body Mass Index (BMI) 40.0 and over, adult: Secondary | ICD-10-CM | POA: Insufficient documentation

## 2022-12-10 HISTORY — DX: Pneumonia, unspecified organism: J18.9

## 2022-12-10 HISTORY — PX: DILATATION & CURETTAGE/HYSTEROSCOPY WITH MYOSURE: SHX6511

## 2022-12-10 HISTORY — DX: Essential (primary) hypertension: I10

## 2022-12-10 HISTORY — PX: HYSTEROSCOPY WITH NOVASURE: SHX5574

## 2022-12-10 LAB — GLUCOSE, CAPILLARY
Glucose-Capillary: 175 mg/dL — ABNORMAL HIGH (ref 70–99)
Glucose-Capillary: 176 mg/dL — ABNORMAL HIGH (ref 70–99)

## 2022-12-10 LAB — POCT PREGNANCY, URINE: Preg Test, Ur: NEGATIVE

## 2022-12-10 SURGERY — DILATATION & CURETTAGE/HYSTEROSCOPY WITH MYOSURE
Anesthesia: General | Site: Vagina

## 2022-12-10 MED ORDER — HYDROMORPHONE HCL 1 MG/ML IJ SOLN: 0.2500 mg | INTRAMUSCULAR | Status: DC | PRN

## 2022-12-10 MED ORDER — PHENYLEPHRINE HCL-NACL 20-0.9 MG/250ML-% IV SOLN
INTRAVENOUS | Status: DC | PRN
  Administered 2022-12-10 (×2): 80 ug via INTRAVENOUS
  Administered 2022-12-10: 160 ug via INTRAVENOUS
  Administered 2022-12-10 (×4): 80 ug via INTRAVENOUS

## 2022-12-10 MED ORDER — SODIUM CHLORIDE 0.9 % IR SOLN
Status: DC | PRN
  Administered 2022-12-10: 3000 mL
  Administered 2022-12-10: 1000 mL

## 2022-12-10 MED ORDER — FENTANYL CITRATE (PF) 250 MCG/5ML IJ SOLN
INTRAMUSCULAR | Status: DC | PRN
  Administered 2022-12-10: 25 ug via INTRAVENOUS
  Administered 2022-12-10: 50 ug via INTRAVENOUS
  Administered 2022-12-10: 25 ug via INTRAVENOUS

## 2022-12-10 MED ORDER — KETOROLAC TROMETHAMINE 15 MG/ML IJ SOLN
INTRAMUSCULAR | Status: DC | PRN
  Administered 2022-12-10: 15 mg via INTRAVENOUS

## 2022-12-10 MED ORDER — LIDOCAINE 2% (20 MG/ML) 5 ML SYRINGE
INTRAMUSCULAR | Status: DC | PRN
  Administered 2022-12-10: 100 mg via INTRAVENOUS

## 2022-12-10 MED ORDER — INSULIN ASPART 100 UNIT/ML IJ SOLN: 0.0000 [IU] | INTRAMUSCULAR | Status: DC | PRN

## 2022-12-10 MED ORDER — BUPIVACAINE HCL (PF) 0.5 % IJ SOLN
INTRAMUSCULAR | Status: AC
  Filled 2022-12-10: qty 30

## 2022-12-10 MED ORDER — ORAL CARE MOUTH RINSE: 15.0000 mL | Freq: Once | OROMUCOSAL | Status: AC

## 2022-12-10 MED ORDER — ONDANSETRON HCL 4 MG/2ML IJ SOLN
INTRAMUSCULAR | Status: DC | PRN
  Administered 2022-12-10: 4 mg via INTRAVENOUS

## 2022-12-10 MED ORDER — ONDANSETRON HCL 4 MG/2ML IJ SOLN: 4.0000 mg | Freq: Once | INTRAMUSCULAR | Status: DC | PRN

## 2022-12-10 MED ORDER — PROPOFOL 10 MG/ML IV BOLUS
INTRAVENOUS | Status: DC | PRN
  Administered 2022-12-10: 200 mg via INTRAVENOUS

## 2022-12-10 MED ORDER — LACTATED RINGERS IV SOLN: INTRAVENOUS | Status: DC

## 2022-12-10 MED ORDER — DEXMEDETOMIDINE HCL IN NACL 80 MCG/20ML IV SOLN
INTRAVENOUS | Status: DC | PRN
  Administered 2022-12-10: 8 ug via INTRAVENOUS
  Administered 2022-12-10: 4 ug via INTRAVENOUS

## 2022-12-10 MED ORDER — EPHEDRINE SULFATE-NACL 50-0.9 MG/10ML-% IV SOSY
PREFILLED_SYRINGE | INTRAVENOUS | Status: DC | PRN
  Administered 2022-12-10 (×3): 5 mg via INTRAVENOUS

## 2022-12-10 MED ORDER — BUPIVACAINE HCL (PF) 0.5 % IJ SOLN
INTRAMUSCULAR | Status: DC | PRN
  Administered 2022-12-10: 20 mL

## 2022-12-10 MED ORDER — KETOROLAC TROMETHAMINE 30 MG/ML IJ SOLN: 30.0000 mg | Freq: Once | INTRAMUSCULAR | Status: DC | PRN

## 2022-12-10 MED ORDER — OXYCODONE HCL 5 MG/5ML PO SOLN: 5.0000 mg | Freq: Once | ORAL | Status: DC | PRN

## 2022-12-10 MED ORDER — MIDAZOLAM HCL 2 MG/2ML IJ SOLN
INTRAMUSCULAR | Status: AC
  Filled 2022-12-10: qty 2

## 2022-12-10 MED ORDER — ACETAMINOPHEN 10 MG/ML IV SOLN
INTRAVENOUS | Status: DC | PRN
  Administered 2022-12-10: 1000 mg via INTRAVENOUS

## 2022-12-10 MED ORDER — CHLORHEXIDINE GLUCONATE 0.12 % MT SOLN
15.0000 mL | Freq: Once | OROMUCOSAL | Status: AC
  Administered 2022-12-10: 15 mL via OROMUCOSAL
  Filled 2022-12-10: qty 15

## 2022-12-10 MED ORDER — FENTANYL CITRATE (PF) 250 MCG/5ML IJ SOLN
INTRAMUSCULAR | Status: AC
  Filled 2022-12-10: qty 5

## 2022-12-10 MED ORDER — OXYCODONE HCL 5 MG PO TABS: 5.0000 mg | ORAL_TABLET | Freq: Once | ORAL | Status: DC | PRN

## 2022-12-10 MED ORDER — DEXAMETHASONE SODIUM PHOSPHATE 10 MG/ML IJ SOLN
INTRAMUSCULAR | Status: DC | PRN
  Administered 2022-12-10: 10 mg via INTRAVENOUS

## 2022-12-10 MED ORDER — MIDAZOLAM HCL 2 MG/2ML IJ SOLN
INTRAMUSCULAR | Status: DC | PRN
  Administered 2022-12-10: 2 mg via INTRAVENOUS

## 2022-12-10 MED ORDER — SOD CITRATE-CITRIC ACID 500-334 MG/5ML PO SOLN
30.0000 mL | ORAL | Status: AC
  Administered 2022-12-10: 30 mL via ORAL
  Filled 2022-12-10: qty 30

## 2022-12-10 MED ORDER — TRAMADOL HCL 50 MG PO TABS
50.0000 mg | ORAL_TABLET | Freq: Four times a day (QID) | ORAL | 0 refills | Status: DC | PRN
Start: 2022-12-10 — End: 2022-12-25

## 2022-12-10 SURGICAL SUPPLY — 15 items
ABLATOR SURESOUND NOVASURE (ABLATOR) ×2 IMPLANT
CATH ROBINSON RED A/P 16FR (CATHETERS) ×1 IMPLANT
DEVICE MYOSURE LITE (MISCELLANEOUS) ×1 IMPLANT
GLOVE BIO SURGEON STRL SZ7 (GLOVE) ×3 IMPLANT
GLOVE SURG UNDER POLY LF SZ7 (GLOVE) ×6 IMPLANT
GOWN STRL REUS W/ TWL LRG LVL3 (GOWN DISPOSABLE) ×3 IMPLANT
GOWN STRL REUS W/ TWL XL LVL3 (GOWN DISPOSABLE) ×3 IMPLANT
GOWN STRL REUS W/TWL LRG LVL3 (GOWN DISPOSABLE) ×3
GOWN STRL REUS W/TWL XL LVL3 (GOWN DISPOSABLE) ×3
KIT PROCEDURE FLUENT (KITS) ×3 IMPLANT
KIT TURNOVER KIT B (KITS) ×3 IMPLANT
PACK VAGINAL MINOR WOMEN LF (CUSTOM PROCEDURE TRAY) ×3 IMPLANT
PAD OB MATERNITY 4.3X12.25 (PERSONAL CARE ITEMS) ×3 IMPLANT
SEAL ROD LENS SCOPE MYOSURE (ABLATOR) ×1 IMPLANT
TOWEL GREEN STERILE FF (TOWEL DISPOSABLE) ×3 IMPLANT

## 2022-12-10 NOTE — H&P (Signed)
Preoperative History and Physical  Hannah Figueroa is a 62 y.o. [id number] here for surgical management of post menopausal bleeding.   Proposed surgery: hysteroscopy with D&C and possible endometrial ablation using Novasure  Past Medical History:  Diagnosis Date   Anemia    Asthma 11/15/2015   Diabetes mellitus without complication    Esophageal reflux 11/15/2015   Hypertension    Pneumonia    Past Surgical History:  Procedure Laterality Date   CESAREAN SECTION     CHOLECYSTECTOMY     OB History     Gravida  4   Para  3   Term  3   Preterm      AB  1   Living  3      SAB  1   IAB      Ectopic      Multiple      Live Births  3          Patient denies any cervical dysplasia or STIs. Medications Prior to Admission  Medication Sig Dispense Refill Last Dose   amLODipine (NORVASC) 10 MG tablet Take 1 tablet (10 mg total) by mouth daily. 30 tablet 11 Past Week   Iron, Ferrous Sulfate, 325 (65 Fe) MG TABS Take 325 mg by mouth daily. 30 tablet 1 12/09/2022   losartan (COZAAR) 100 MG tablet Take 1 tablet (100 mg total) by mouth daily. 90 tablet 3 12/09/2022   melatonin 3 MG TABS tablet Take 6 mg by mouth at bedtime.   12/09/2022   metFORMIN (GLUCOPHAGE) 500 MG tablet Take 1 tablet (500 mg total) by mouth 2 (two) times daily with a meal. 60 tablet 3 12/09/2022   atorvastatin (LIPITOR) 10 MG tablet Take 1 tablet (10 mg total) by mouth daily. (Patient not taking: Reported on 12/08/2022) 90 tablet 3 More than a month   Blood Glucose Monitoring Suppl (ACCU-CHEK AVIVA PLUS) w/Device KIT Check blood sugar BID  one time early morning fasting and second time after lunch or dinner. 1 kit 0    glucose blood (ACCU-CHEK AVIVA PLUS) test strip Check blood sugar BID  one time early morning fasting and second time after lunch or dinner. 100 each 12    Lancets (ACCU-CHEK MULTICLIX) lancets Check blood sugar BID  one time early morning fasting and second time after lunch or dinner. 100 each 12     megestrol (MEGACE) 40 MG tablet Take 1 tablet (40 mg total) by mouth 2 (two) times daily. Can increase to two tablets twice a day in the event of heavy bleeding 60 tablet 3 More than a month    Allergies  Allergen Reactions   Meloxicam Itching and Swelling   Penicillins     Unknown reaction    Social History:   reports that she has quit smoking. Her smoking use included cigarettes. She has never used smokeless tobacco. She reports that she does not drink alcohol and does not use drugs. Family History  Problem Relation Age of Onset   Hypertension Mother    Diabetes Mother    Asthma Father    Diabetes Father    Heart attack Father     Review of Systems: Noncontributory  PHYSICAL EXAM: Blood pressure (!) 134/92, pulse 90, temperature 98 F (36.7 C), temperature source Oral, resp. rate 18, height  (1.651 m), weight (!) 156.5 kg, last menstrual period 11/18/2022, SpO2 95 %. General appearance - alert, well appearing, and in no distress Chest - clear to auscultation, no wheezes,  rales or rhonchi, symmetric air entry Heart - normal rate and regular rhythm Abdomen - soft, nontender, nondistended, no masses or organomegaly Pelvic - examination not indicated Extremities - peripheral pulses normal, no pedal edema, no clubbing or cyanosis  Labs: Results for orders placed or performed during the hospital encounter of 12/10/22 (from the past 336 hour(s))  Glucose, capillary   Collection Time: 12/10/22  6:16 AM  Result Value Ref Range   Glucose-Capillary 175 (H) 70 - 99 mg/dL  Pregnancy, urine POC   Collection Time: 12/10/22  6:27 AM  Result Value Ref Range   Preg Test, Ur NEGATIVE NEGATIVE    Imaging Studies: No results found.  Assessment: Patient Active Problem List   Diagnosis Date Noted   Chronic pain of both knees 10/24/2016   Primary osteoarthritis of both knees 10/24/2016   Irregular menses 11/26/2015   Morbid obesity with body mass index of 70 and over in adult  11/23/2015   Aphasia 11/15/2015   Asthma 11/15/2015   Esophageal reflux 11/15/2015   Heart palpitations 11/15/2015   OSA (obstructive sleep apnea) 11/15/2015    Plan: Patient will undergo surgical management with hysteroscopy with D&C and possible endometrial ablation using Novasure.   The risks of surgery were discussed in detail with the patient including but not limited to: bleeding which may require transfusion or reoperation; infection which may require antibiotics; injury to surrounding organs which may involve bowel, bladder, ureters ; need for additional procedures including laparoscopy or laparotomy; thromboembolic phenomenon, surgical site problems and other postoperative/anesthesia complications. Likelihood of success in alleviating the patient's condition was discussed. Routine postoperative instructions will be reviewed with the patient and her family in detail after surgery.  The patient concurred with the proposed plan, giving informed written consent for the surgery.  Patient has been NPO since last night she will remain NPO for procedure.  Anesthesia and OR aware.  Preoperative prophylactic antibiotics and SCDs ordered on call to the OR.  To OR when ready.  Herbert Aguinaldo L. Erin Fulling, M.D., [facility] 12/10/2022 7:07 AM

## 2022-12-10 NOTE — Anesthesia Postprocedure Evaluation (Signed)
Anesthesia Post Note  Patient: Hannah Figueroa  Procedure(s) Performed: DILATATION & CURETTAGE, HYSTEROSCOPY, POLYPECTOMY  WITH MYOSURE HYSTEROSCOPY WITH ATTEMPTED NOVASURE (Vagina )     Patient location during evaluation: PACU Anesthesia Type: General Level of consciousness: awake and alert Pain management: pain level controlled Vital Signs Assessment: post-procedure vital signs reviewed and stable Respiratory status: spontaneous breathing, nonlabored ventilation, respiratory function stable and patient connected to nasal cannula oxygen Cardiovascular status: blood pressure returned to baseline and stable Postop Assessment: no apparent nausea or vomiting Anesthetic complications: no  No notable events documented.  Last Vitals:  Vitals:   12/10/22 0845 12/10/22 0900  BP: (!) 152/74 134/63  Pulse: 90 84  Resp: 13 11  Temp:  36.4 C  SpO2: 92% 98%    Last Pain:  Vitals:   12/10/22 0900  TempSrc:   PainSc: 0-No pain                 Trevor Iha

## 2022-12-10 NOTE — Anesthesia Procedure Notes (Signed)
Procedure Name: LMA Insertion Date/Time: 12/10/2022 7:27 AM  Performed by: Loleta Audry Kauzlarich, CRNAPre-anesthesia Checklist: Patient identified, Patient being monitored, Timeout performed, Emergency Drugs available and Suction available Patient Re-evaluated:Patient Re-evaluated prior to induction Oxygen Delivery Method: Circle system utilized Preoxygenation: Pre-oxygenation with 100% oxygen Induction Type: IV induction Ventilation: Mask ventilation without difficulty LMA: LMA inserted LMA Size: 4.0 Tube type: Oral Number of attempts: 1 Placement Confirmation: positive ETCO2 and breath sounds checked- equal and bilateral Tube secured with: Tape Dental Injury: Teeth and Oropharynx as per pre-operative assessment

## 2022-12-10 NOTE — Brief Op Note (Signed)
12/10/2022  8:06 AM  PATIENT:  Hannah Figueroa  62 y.o. female  PRE-OPERATIVE DIAGNOSIS:  AUB Thickened Endometrium  POST-OPERATIVE DIAGNOSIS:  AUBThickened Endometrium  PROCEDURE:  Procedure(s) with comments: DILATATION & CURETTAGE, HYSTEROSCOPY, POLYPECTOMY  WITH MYOSURE HYSTEROSCOPY WITH ATTEMPTED NOVASURE - DEVICE UNABLE TO REACH UTERINE CAVITY  SURGEON:  Surgeon(s) and Role:    * Willodean Rosenthal, MD - Primary  ANESTHESIA:   general  [id number]   BLOOD ADMINISTERED:none  DRAINS: none   LOCAL MEDICATIONS USED:  MARCAINE     SPECIMEN:  Source of Specimen:  endometrium and endometrial polyps  DISPOSITION OF SPECIMEN:  PATHOLOGY  COUNTS:  YES  TOURNIQUET:  * No tourniquets in log *  DICTATION: .Note written in EPIC  PLAN OF CARE: Discharge to home after PACU  PATIENT DISPOSITION:  PACU - hemodynamically stable.   Delay start of Pharmacological VTE agent (>24hrs) due to surgical blood loss or risk of bleeding: not applicable  Complications: none immediate   Shantai Tiedeman L. Harraway-Smith, M.D., [facility]

## 2022-12-10 NOTE — Transfer of Care (Signed)
Immediate Anesthesia Transfer of Care Note  Patient: Hannah Figueroa  Procedure(s) Performed: DILATATION & CURETTAGE, HYSTEROSCOPY, POLYPECTOMY  WITH MYOSURE HYSTEROSCOPY WITH ATTEMPTED NOVASURE (Vagina )  Patient Location: PACU  Anesthesia Type:General  Level of Consciousness: awake  Airway & Oxygen Therapy: Patient Spontanous Breathing  Post-op Assessment: Report given to RN and Post -op Vital signs reviewed and stable  Post vital signs: Reviewed and stable  Last Vitals:  Vitals Value Taken Time  BP 156/75 12/10/22 0819  Temp    Pulse 117 12/10/22 0822  Resp 19 12/10/22 0822  SpO2 97 % 12/10/22 0822  Vitals shown include unvalidated device data.  Last Pain:  Vitals:   12/10/22 0614  TempSrc: Oral  PainSc: 0-No pain         Complications: No notable events documented.

## 2022-12-10 NOTE — Op Note (Signed)
12/10/2022  8:06 AM  PATIENT:  Hannah Figueroa  62 y.o. female  PRE-OPERATIVE DIAGNOSIS:  AUB Thickened Endometrium  POST-OPERATIVE DIAGNOSIS:  AUBThickened Endometrium  PROCEDURE:  Procedure(s) with comments: DILATATION & CURETTAGE, HYSTEROSCOPY, POLYPECTOMY  WITH MYOSURE HYSTEROSCOPY WITH ATTEMPTED NOVASURE - DEVICE UNABLE TO REACH UTERINE CAVITY  SURGEON:  Surgeon(s) and Role:    * Willodean Rosenthal, MD - Primary  ANESTHESIA:   general  [id number]   BLOOD ADMINISTERED:none  DRAINS: none   LOCAL MEDICATIONS USED:  MARCAINE     SPECIMEN:  Source of Specimen:  endometrium and endometrial polyps  DISPOSITION OF SPECIMEN:  PATHOLOGY  COUNTS:  YES  TOURNIQUET:  * No tourniquets in log *  DICTATION: .Note written in EPIC  PLAN OF CARE: Discharge to home after PACU  PATIENT DISPOSITION:  PACU - hemodynamically stable.   Delay start of Pharmacological VTE agent (>24hrs) due to surgical blood loss or risk of bleeding: not applicable  Complications: none immediate   INDICATIONS: 62 y.o. [id number]  here for scheduled surgery for postmenopausal bleeding and thickened endometrium. Risks of surgery were discussed with the patient including but not limited to: bleeding which may require transfusion; infection which may require antibiotics; injury to uterus or surrounding organs; intrauterine scarring which may impair future fertility; need for additional procedures including laparotomy or laparoscopy; and other postoperative/anesthesia complications. Written informed consent was obtained.    Findings: multiple endometrial polyps.   PROCEDURE DETAILS:  The patient was taken to the operating room where general anesthesia was administered with LMA and was found to be adequate.  After an adequate timeout was performed, she was placed in the dorsal lithotomy position and examined; then prepped and draped in the sterile manner.   Her bladder was catheterized for an unmeasured amount  of clear, yellow urine. A speculum was then placed in the patient's vagina and a single tooth tenaculum was applied to the anterior lip of the cervix.   The cervix was sounded to 10.5 cm and dilated manually with metal dilators to accommodate the 5 mm diagnostic hysteroscope.  Once the cervix was dilated, the hysteroscope was inserted under direct visualization using 1.5% glycine as a suspension medium.  The uterine cavity was carefully examined, both ostia were recognized, and diffusely proliferative endometrium with multiple polyps was noted.   After further careful visualization of the uterine cavity, the hysteroscope was removed under direct visualization.  A sharp curettage was then performed to obtain a moderate amount of endometrial curettings.  The hysteroscope was reinserted and the cavity was found to be intact with no lesions or masses appreciated. At this time the Myosure device was used to resect the polyps that were noted. This was completed with no complications. An attempts was made ot insert the Novasure device however the device would not open fully within the cavity so this was not completed.  The tenaculum was removed from the anterior lip of the cervix and the vaginal speculum was removed after noting good hemostasis. The fluid deficit 250cc.  The patient tolerated the procedure well and was taken to the recovery area awake, extubated and in stable condition.  The patient will be discharged to home as per PACU criteria.  Routine postoperative instructions given.  She was prescribed Tramadol for pain.  She will follow up in the clinic in 6 weeks for postoperative evaluation.  Murry Khiev L. Harraway-Smith, M.D., [facility]

## 2022-12-11 ENCOUNTER — Encounter (HOSPITAL_COMMUNITY): Payer: Self-pay | Admitting: Obstetrics & Gynecology

## 2022-12-11 LAB — SURGICAL PATHOLOGY

## 2022-12-16 ENCOUNTER — Other Ambulatory Visit: Payer: Self-pay

## 2022-12-16 DIAGNOSIS — C541 Malignant neoplasm of endometrium: Secondary | ICD-10-CM

## 2022-12-16 DIAGNOSIS — N8502 Endometrial intraepithelial neoplasia [EIN]: Secondary | ICD-10-CM

## 2022-12-16 NOTE — Progress Notes (Signed)
Referral placed per Dr. Erin Fulling. Armandina Stammer RN

## 2022-12-17 ENCOUNTER — Encounter: Payer: Self-pay | Admitting: Obstetrics & Gynecology

## 2022-12-17 ENCOUNTER — Telehealth: Payer: Self-pay | Admitting: *Deleted

## 2022-12-17 ENCOUNTER — Ambulatory Visit (INDEPENDENT_AMBULATORY_CARE_PROVIDER_SITE_OTHER): Payer: BC Managed Care – PPO | Admitting: Obstetrics & Gynecology

## 2022-12-17 VITALS — BP 140/45 | HR 87 | Wt 341.0 lb

## 2022-12-17 DIAGNOSIS — N8502 Endometrial intraepithelial neoplasia [EIN]: Secondary | ICD-10-CM | POA: Diagnosis not present

## 2022-12-17 DIAGNOSIS — C541 Malignant neoplasm of endometrium: Secondary | ICD-10-CM | POA: Diagnosis not present

## 2022-12-17 NOTE — Patient Instructions (Signed)
Endometrial Hyperplasia Endometrial hyperplasia is abnormal thickening of the tissue that lines the inside of the uterus (endometrium). This can cause heavy bleeding. Endometrial hyperplasia can cause cell changes that can lead to endometrial cancer. Types of this condition include: Simple hyperplasia. This is tissue thickening only. Complex hyperplasia. This is tissue thickening with an overgrowth of cells. Simple atypical hyperplasia. This is tissue thickening with cell changes. This type has a low risk of leading to cancer. Complex atypical hyperplasia. This is tissue thickening, cell crowding, and cell changes. This has a higher risk of becoming cancerous. Early diagnosis and treatment are important to reduce the risk of cancer. What are the causes? This condition is caused by too much estrogen and not enough progesterone in the body. What increases the risk? The following factors may make you more likely to develop this condition: The onset of menopause. Menopause happens when you have gone 12 months without a menstrual period. Having conditions, such as: Polycystic ovary syndrome (PCOS). Diabetes. Obesity. Taking an estrogen medicine or a medicine that acts like estrogen. Having started menstrual periods early or menopause late. Having never been pregnant. Having a family history of uterine, ovarian, or colon cancer. What are the signs or symptoms? The main symptom of this condition is abnormal vaginal bleeding, such as: Bleeding that is heavier and longer during menstrual periods. Bleeding that happens between menstrual periods. Bleeding that happens after menopause. Some people have no symptoms. How is this diagnosed? This condition may be diagnosed based on: Your symptoms, risk factors, and medical history. A physical exam. Tests, such as: Transvaginal ultrasound. This imaging test uses a probe that is inserted into the vagina to check the endometrium. Endometrial biopsy.  Samples of endometrial tissue are taken and looked at under a microscope. Dilation and curettage. Tissue is scraped or suctioned from the inside of the uterus and looked at under a microscope. Hysteroscopy. This procedure uses a thin, lighted device to see inside the uterus. How is this treated? Treatment for this condition depends on the type of hyperplasia that you have. Synthetic progesterone (progestin) is the most common treatment. It can be given as a pill, injection, or vaginal cream. It can also be given as an intrauterine device (IUD) that is implanted in your uterus. If you have hyperplasia with atypia, you may need a hysterectomy. This is surgery to remove your uterus. You may be more likely to have a hysterectomy if: You have complex atypical hyperplasia. You are past menopause. You do not want to become pregnant. Follow these instructions at home: Take over-the-counter and prescription medicines only as told by your health care provider. Lose weight if you are obese. Ask your health care provider to recommend a diet and exercise program to help you reach and maintain a healthy weight. Keep all follow-up visits. This is important. Contact a health care provider if: You have menstrual periods that are heavier or last longer than usual. You have menstrual periods more often than usual. You have vaginal bleeding after menopause. Get help right away if: You have heavy vaginal bleeding that soaks a sanitary pad in less than 2 hours. You pass blood clots that are larger than 1 inch (2.5 cm) in diameter. Summary Endometrial hyperplasia is abnormal thickening of the tissue that lines the inside of the uterus (endometrium). The main symptom of this condition is abnormal vaginal bleeding. This may mean heavier, longer, or more frequent menstrual periods, or it may mean vaginal bleeding after menopause. Some types of hyperplasia   cause cell changes that can lead to cancer. Always let your  health care provider know about abnormal vaginal bleeding. Early diagnosis and treatment are important to reduce the risk of cancer. This information is not intended to replace advice given to you by your health care provider. Make sure you discuss any questions you have with your health care provider. Document Revised: 02/23/2020 Document Reviewed: 02/23/2020 Elsevier Patient Education  2023 [employer]   Uterine Cancer  Uterine cancer is an abnormal growth of cancer tissue in the womb (uterus). Uterine cancer can spread to other parts of the body. Uterine cancer usually occurs after a woman stops having menstrual periods (menopause). However, it may also occur around the time that menopause starts. The wall of the uterus has an inner layer of tissue called the endometrium and an outer layer of muscle tissue called the myometrium. The most common type of uterine cancer is endometrial cancer, and it starts in the endometrium. Cancer that starts in the myometrium (uterine sarcoma) is very rare. What are the causes? The exact cause of this condition is not known. What increases the risk? You are more likely to develop this condition if you: Are older than 50. Have a thicker than normal endometrium (endometrial hyperplasia). Have never been pregnant or cannot have children. Started menstruating when you were younger than 60 years old or are older than 84 and having menstrual periods. Have a history of cancer, including: Cancer of the ovaries, or cancer of the intestines, colon, or rectum (colorectal cancer). A family history of uterine cancer or hereditary nonpolyposis colon cancer. Have a long-term (chronic) condition, such as: Diabetes. High blood pressure. Thyroid disease. Gallbladder disease. Obesity. A personal history of enlarged ovaries with small cysts (polycystic ovarian syndrome). Have been exposed to: Radiation. Cigarette smoke. Hormone therapy or a medicine called  tamoxifen. What are the signs or symptoms? Symptoms of this condition include: Abnormal vaginal bleeding or discharge. Bleeding may start as a watery, blood-streaked flow that gradually contains more blood. This is the most common symptom. If you experience abnormal vaginal bleeding, do not assume that it is part of menopause. Vaginal bleeding after menopause. Unexplained weight loss. Bleeding between periods. Urination that is difficult, painful, or more frequent than usual. A lump in the vagina. Pain, bloating, fullness in the abdomen, or pelvic pain. You may also have pain during sex. How is this diagnosed? This condition may be diagnosed based on your medical history, symptoms, a physical exam, and a pelvic exam. During the pelvic exam, your health care provider will feel your pelvis for any growths, as well as for any enlarged lymph nodes. You may also have tests, including: Blood and urine tests. Imaging tests, such as X-rays, CT scans, ultrasound, or MRIs. Hysteroscopy. This involves inserting a thin, flexible tube with a light and camera on the end through the vagina to look inside the uterus. A Pap test to check for abnormal cells in the lower part of the uterus (cervix) and the upper vagina. Removal of a tissue sample (biopsy) from the uterine lining. The sample will be examined in a lab to check for cancer cells. Dilation and curettage (D&C). This procedure involves stretching the cervix and scraping the lining of the uterus to get a tissue sample to check for cancer cells. The cancer is staged to determine its severity and extent. Staging is an assessment of the size of the tumor and if, or where, the cancer has spread. The stages of uterine cancer are:  Stage I. The cancer is only in the uterus. Stage II. The cancer has spread to the cervix. Stage III. The cancer has spread outside the uterus, but not outside the pelvis. The cancer may have spread to the lymph nodes in the pelvis.  Lymph nodes are part of your body's disease-fighting (immune) system. Stage IV. The cancer has spread to other parts of the body, such as the bladder, rectum, and other organs. How is this treated? This condition is often treated with surgery to remove: The uterus, cervix, fallopian tubes, and ovaries (total hysterectomy). The uterus and cervix (simple hysterectomy). The type of hysterectomy you will have depends on the extent of your cancer. Lymph nodes near the uterus may also be removed. Treatment may include one or more of the following: Chemotherapy. These are medicines to kill cancer cells or to slow their growth. Chemotherapy also kills other normal, healthy cells, including blood-forming cells in the bone marrow, hair follicles, and cells in the mouth, digestive tract, and reproductive system. Radiation therapy. This uses high-energy rays to kill cancer cells and prevent their spread. Chemoradiation. This treatment alternates chemotherapy with radiation treatments to enhance the effects of radiation. Brachytherapy. This involves placing small radioactive capsules inside the body where the cancer was removed. Hormone therapy. This includes taking medicines that lower the levels of estrogen in the body. Immunotherapy. These medicines help your immune system fight cancer. Targeted therapy. These medicines specifically kill cancer cells. Follow these instructions at home: Medicines Take over-the-counter and prescription medicines only as told by your health care provider. Ask your health care provider if the medicine prescribed to you requires you to avoid driving or using machinery. Activity Return to your normal activities as told by your health care provider. Ask your health care provider what activities are safe for you. Get regular exercise. Aim for 30 minutes of moderate-intensity activity 5 times a week. Examples of moderate-intensity activity include walking and yoga. Be sure to talk  with your health care provider before starting any exercise routine. Lifestyle Eat a healthy diet. A healthy diet includes a lot of fruits and vegetables, low-fat dairy products, lean meats, and fiber. Do not use any products that contain nicotine or tobacco. These products include cigarettes, chewing tobacco, and vaping devices, such as e-cigarettes. If you need help quitting, ask your health care provider. Consider joining a support group to help you cope with stress. Your health care provider may be able to recommend a local or online support group. General instructions Drink enough fluid to keep your urine pale yellow. Do not have sex, douche, or place anything in your vagina, such as a tampon or diaphragm, until your health care provider says that it is safe. Work with your health care provider to: Manage any chronic conditions you have. Manage any side effects of your treatment. Keep all follow-up visits. This is important. Where to find more information American Cancer Society: www.cancer.org National Cancer Institute: www.cancer.gov Contact a health care provider if: You have pain in your pelvis or abdomen that gets worse. You cannot urinate. You have abnormal vaginal bleeding. You have a fever. Get help right away if: You develop sudden or new severe symptoms, such as: Heavy bleeding. Severe weakness. Pain that is severe or does not get better with medicine. Summary Uterine cancer is an abnormal growth of cancer tissue in the uterus. The most common type of uterine cancer starts in the endometrium and is called endometrial cancer. This condition is often treated with surgery  to remove the uterus, cervix, fallopian tubes, and ovaries (total hysterectomy) or the uterus and cervix (simple hysterectomy). Work with your health care provider to manage any long-term (chronic) conditions you have, such as diabetes, high blood pressure, thyroid disease, or gallbladder disease. Consider  joining a support group to help you cope with stress. Your health care provider may be able to recommend a local or online support group. This information is not intended to replace advice given to you by your health care provider. Make sure you discuss any questions you have with your health care provider. Document Revised: 03/20/2020 Document Reviewed: 03/20/2020 Elsevier Patient Education  2023 [employer].

## 2022-12-17 NOTE — Telephone Encounter (Signed)
Dr Deno Lunger office called and scheduled a new patient appt with Dr Pricilla Holm on 5/10 at 9 am. That office will advise the patient of the appt date and time

## 2022-12-17 NOTE — Progress Notes (Signed)
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  Pt reports that she is doing well. She is eating and passing stools without difficulty.  She is not having pain or bleeding.     The following portions of the patient's history were reviewed and updated as appropriate: allergies, current medications, past family history, past medical history, past social history, past surgical history and problem list.  Review of Systems:  Pertinent items are noted in HPI.    Objective:  Physical Exam BP (!) 140/45   Pulse 87   Wt (!) 341 lb (154.7 kg)   LMP 11/18/2022   BMI 56.75 kg/m   CONSTITUTIONAL: Well-developed, well-nourished female in no acute distress.  HENT:  Normocephalic, atraumatic EYES: Conjunctivae and EOM are normal. No scleral icterus.  NECK: Normal range of motion SKIN: Skin is warm and dry. No rash noted. Not diaphoretic.No pallor. NEUROLGIC: Alert and oriented to person, place, and time. Normal coordination.  Pelvic: deferred  Labs and Imaging Surg path 12/10/2022 FINAL MICROSCOPIC DIAGNOSIS:   A. ENDOMETRIUM, CURETTAGE:  - Benign inactive endometrium  - Scant fragments of benign endometrial polyp  - Negative for hyperplasia or malignancy   B. 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  Dr. Luisa Hart reviewed the case and concurs with the  diagnosis.  Dr. Erin Fulling was paged 12/11/2022.     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 Reviewed  surg path. EIN with rare foci of adenocarcinoma. Reviewed with pt and husband. Rec referral to GYN ONC for surgical management and treatment.    All questions answered.    Will query Dr. Pricilla Holm to see if CT abd/pelvis indicated.  Total face-to-face time with patient,  review of chart, discussion with consultant and coordination of care was .     Kamiya Acord L. Harraway-Smith, M.D., [facility] L. Harraway-Smith, M.D., [facility]

## 2022-12-17 NOTE — Progress Notes (Signed)
GYN ONC appt with Dr. Pricilla Holm Friday May 10th. Arrive at 830am for 9am appt. Armandina Stammer RN

## 2022-12-25 ENCOUNTER — Encounter: Payer: Self-pay | Admitting: Gynecologic Oncology

## 2022-12-25 NOTE — Progress Notes (Signed)
GYNECOLOGIC ONCOLOGY NEW PATIENT CONSULTATION   Patient Name: Hannah Figueroa  Patient Age: 62 y.o. 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    We discussed her biopsy in detail which showed EIN within endometrial polyps and several small foci that were concerning for low-grade endometrioid adenocarcinoma although not diagnostic.  Her endometrial curettings after polypectomy was negative for any hyperplasia or malignancy which would suggest that EIN, possible low-grade cancer was confined to polyps.  Given her multiple medical co-morbidities, notably her obesity and diabetes, she is at higher surgical risk.  We briefly discussed definitive surgical therapy, but I recommend medical management options at this time for disease stabilization and reduction of risk in development of endometrial cancer.  We reviewed the role of progesterone therapy and the effect on preneoplastic and neoplastic lesions, believed to include induction of apoptosis in addition to tissue sloughing during withdrawal bleeding.  Activation of the progesterone receptors is believed to lead stromal decidualization and thinning of the lining.  We reviewed the 3 most studied options, to include levonorgesterol IUD, oral medorxyprogesterone acetate, oral megesterol acetate.    She understands that all options have few side effects, most common being infrequent edema, GI disturbances, and thromboembolic events), but that local progesterone through IUD may have a stronger effect on the endometrium with less systemic side effects.  Furthermore, studies  demonstrate that 80-90% of women will have regression of their hyperplasia with progesterone use.    With IUD use, we anticipate that the average duration to regression is 4.5 months, with all cases anticipated to have regression by 9 months.     For monitoring, she understands that there is no recommend standard of care.  We will base her surveillance on GOG 224 protocol.  This will include EMB at 3 months following initiation of treatment.  EMBs will be performed at 3 to 6 month intervals, until a minimum of 3 negative biopsy results are obtained, after which sampling frequeny may then be yearly or until new abnormal uterine bleeding develops.  If persistence of EIN is noted by 9 months of treatment, then we will discuss additional agents or fitness/readiness for surgery.    We also reviewed the importance of weight loss in overall health as well as reduction in cancer risk.  I have offered nutrition/exercise counseling today.  The patient has lost almost 25 pounds in about a month and a half.  She was congratulated on this weight loss to date and seems very motivated to continue working towards weight loss.  Discussion, the patient is happy to hear that she does not have to have major surgery at this time.  Since she recently had a D&C, I think that we could proceed with either Mirena IUD placement in clinic or start her on oral progesterone.  Her preference after discussing these options was to proceed with IUD placement.  She would like to do this right after [facility] Day.  Exam was deferred today given upcoming IUD placement.  From a follow-up standpoint, I will plan to see her every 3 months for repeat biopsy.  This will give [place] time to check-in as she continues to lose weight.  If she achieves some more  weight loss and surgery becomes safer, we may proceed at that time with definitive surgical therapy.  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  A copy of this note was sent to the patient's referring provider.   75 minutes of total time was spent for this patient encounter, including preparation, face-to-face counseling with the patient and coordination of care, and documentation of the encounter.  Eugene Garnet, MD  Division of Gynecologic Oncology  Department of Obstetrics and Gynecology  [facility]  ___________________________________________  Chief Complaint: Chief Complaint  Patient presents with   EIN (endometrial intraepithelial neoplasia)    History of Present Illness: Hannah Figueroa is a 62 y.o. y.o. female who is seen in consultation at the request of Dr. Erin Fulling for an evaluation of EIN, possible endometrial adenocarcinoma.  The patient was initially seen in October 2020 with daily postmenopausal bleeding for 3 years.  Pathology at that time showed both an endometrial polyp as well as an endocervical polyp without hyperplasia or malignancy.  Patient then represented in February of this year with continued daily bleeding although less heavy than it had been.  She was prescribed Megace at a couple of points previously but lost the prescription each time.  10/08/22: Endometrial biopsy performed in the office showed benign endometrial polyp with hyperplasia without atypia.  No malignancy. 10/08/2022: Pelvic ultrasound exam shows a uterus measuring 10.7 x 6.1 x 7.2 with a heterogeneous myometrium.  Hypoechoic soft tissue mass seen in the anterior right uterus measuring up to 5 cm, potentially an exophytic fibroid although incompletely characterized.  Endometrium measures 13 mm.  Neither ovary definitively seen. 12/08/2022: Patient underwent hysteroscopy with polypectomy, D&C.  Plan had been for endometrial ablation with the NovaSure.  Device would not  fully open the cavity so endometrial ablation was abandoned. Endometrial curettage showed benign inactive endometrium, scant fragments of benign endometrial polyp, no hyperplasia or malignancy.  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  There are rare foci concerning for well-differentiated endometrial adenocarcinoma.  Her PMH is notable for DM2. Last hemoglocin A1c was 10.5%.  She notes that since starting her diet, her blood glucose has ranged between 118 and 130.  She is currently on metformin managed by her primary care provider.  Today, the patient presents with her husband.  She denies any vaginal bleeding since the surgery.  She notes having regular monthly menses with heavier bleeding and cramping until 5 or 7 years ago.  At that point, she continued to have a monthly cycle but would have almost daily spotting between menses.  She denied any pain or cramping during that time.  More recently, within the last 6 months or so, she notes that menses have become lighter and instead of having heavy bleeding 5-7 days a month, would often have just a few days of lighter bleeding.  She continued to have almost daily spotting though.  She endorses a good appetite.  She is doing a weight loss program ([employer]) and not using any medications.  Has achieved 24 pounds of weight loss since the very end of March.  She endorses normal bowel bladder function.  PAST MEDICAL HISTORY:  Past Medical History:  Diagnosis Date   Anemia    Asthma 11/15/2015   hospitalized as a child, uses daily steroid, rare  rescue inhaler use   Diabetes mellitus without complication (HCC)    Esophageal reflux 11/15/2015   Hypertension    Pneumonia      PAST SURGICAL HISTORY:  Past Surgical History:  Procedure Laterality Date   CESAREAN SECTION     x2   CHOLECYSTECTOMY     DILATATION & CURETTAGE/HYSTEROSCOPY WITH MYOSURE  12/10/2022   Procedure: DILATATION & CURETTAGE, HYSTEROSCOPY, POLYPECTOMY   WITH MYOSURE;  Surgeon: Willodean Rosenthal, MD;  Location: [facility] OR;  Service: Gynecology;;   HYSTEROSCOPY WITH NOVASURE  12/10/2022   Procedure: HYSTEROSCOPY WITH ATTEMPTED NOVASURE;  Surgeon: Willodean Rosenthal, MD;  Location: [facility] OR;  Service: Gynecology;;  DEVICE UNABLE TO REACH UTERINE CAVITY   TUBAL LIGATION      OB/GYN HISTORY:  OB History  Gravida Para Term Preterm AB Living  4 3 3   1 3   SAB IAB Ectopic Multiple Live Births  1       3    # Outcome Date GA Lbr Len/2nd Weight Sex Delivery Anes PTL Lv  4 Term 12/25/84    F CS-LTranv   LIV  3 SAB 08/19/83          2 Term 01/21/83    M CS-LTranv   LIV  1 Term 04/26/81    F Vag-Spont   LIV    Patient's last menstrual period was 11/18/2022.  Age at menarche: 61  Age at menopause: n/a Hx of HRT: denies Hx of STDs: denies Last pap: 09/2022 - NIML, HR HPV negative History of abnormal pap smears: denies  SCREENING STUDIES:  Last mammogram: has one ordered, thinks her last was in 2020  Last colonoscopy: 2020  MEDICATIONS: Outpatient Encounter Medications as of 12/26/2022  Medication Sig   amLODipine (NORVASC) 10 MG tablet Take 1 tablet (10 mg total) by mouth daily.   atorvastatin (LIPITOR) 10 MG tablet Take 1 tablet (10 mg total) by mouth daily.   Blood Glucose Monitoring Suppl (ACCU-CHEK AVIVA PLUS) w/Device KIT Check blood sugar BID  one time early morning fasting and second time after lunch or dinner.   glucose blood (ACCU-CHEK AVIVA PLUS) test strip Check blood sugar BID  one time early morning fasting and second time after lunch or dinner.   Iron, Ferrous Sulfate, 325 (65 Fe) MG TABS Take 325 mg by mouth daily.   Lancets (ACCU-CHEK MULTICLIX) lancets Check blood sugar BID  one time early morning fasting and second time after lunch or dinner.   levonorgestrel (MIRENA) 20 MCG/DAY IUD 1 each by Intrauterine route once for 1 dose.   losartan (COZAAR) 100 MG tablet Take 1 tablet (100 mg total) by mouth daily.   melatonin  3 MG TABS tablet Take 6 mg by mouth at bedtime.   metFORMIN (GLUCOPHAGE) 500 MG tablet Take 1 tablet (500 mg total) by mouth 2 (two) times daily with a meal.   [DISCONTINUED] megestrol (MEGACE) 40 MG tablet Take 1 tablet (40 mg total) by mouth 2 (two) times daily. Can increase to two tablets twice a day in the event of heavy bleeding (Patient not taking: Reported on 12/17/2022)   [DISCONTINUED] traMADol (ULTRAM) 50 MG tablet Take 1 tablet (50 mg total) by mouth every 6 (six) hours as needed for severe pain. (Patient not taking: Reported on 12/17/2022)   No facility-administered encounter medications on file as of 12/26/2022.    ALLERGIES:  Allergies  Allergen Reactions   Meloxicam Itching and Swelling   Penicillins     Unknown reaction  FAMILY HISTORY:  Family History  Problem Relation Age of Onset   Hypertension Mother    Diabetes Mother    Asthma Father    Diabetes Father    Heart attack Father    Ovarian cancer Sister        30 years ago, died at age 22   Colon cancer Neg Hx    Breast cancer Neg Hx    Endometrial cancer Neg Hx    Pancreatic cancer Neg Hx    Prostate cancer Neg Hx      SOCIAL HISTORY:  Social Connections: Not on file    REVIEW OF SYSTEMS:  Denies appetite changes, fevers, chills, fatigue, unexplained weight changes. Denies hearing loss, neck lumps or masses, mouth sores, ringing in ears or voice changes. Denies cough or wheezing.  Denies shortness of breath. Denies chest pain or palpitations. Denies leg swelling. Denies abdominal distention, pain, blood in stools, constipation, diarrhea, nausea, vomiting, or early satiety. Denies pain with intercourse, dysuria, frequency, hematuria or incontinence. Denies hot flashes, pelvic pain, vaginal bleeding or vaginal discharge.   Denies joint pain, back pain or muscle pain/cramps. Denies itching, rash, or wounds. Denies dizziness, headaches, numbness or seizures. Denies swollen lymph nodes or glands, denies  easy bruising or bleeding. Denies anxiety, depression, confusion, or decreased concentration.  Physical Exam:  Vital Signs for this encounter:  Blood pressure (!) 157/74, pulse 91, temperature 98.3 F (36.8 C), temperature source Oral, resp. rate 16, height 5' 4.96" (1.65 m), weight (!) 340 lb 12.8 oz (154.6 kg), last menstrual period 11/18/2022, SpO2 100 %. Body mass index is 56.78 kg/m. General: Alert, oriented, no acute distress.  HEENT: Normocephalic, atraumatic. Sclera anicteric.  Chest: Clear to auscultation bilaterally. No wheezes, rhonchi, or rales. Cardiovascular: Regular rate and rhythm, no murmurs, rubs, or gallops.  Extremities: Grossly normal range of motion. Warm, well perfused. No edema bilaterally.  Skin: No rashes or lesions.  Lymphatics: No cervical, supraclavicularl adenopathy.   LABORATORY AND RADIOLOGIC DATA:  Outside medical records were reviewed to synthesize the above history, along with the history and physical obtained during the visit.   Lab Results  Component Value Date   WBC 12.1 (H) 11/18/2022   HGB 8.5 (L) 11/18/2022   HCT 32.9 (L) 11/18/2022   PLT 462 (H) 11/18/2022   GLUCOSE 190 (H) 11/18/2022   CHOL 123 09/30/2022   TRIG 73.0 09/30/2022   HDL 39.70 09/30/2022   LDLCALC 69 09/30/2022   ALT 11 11/18/2022   AST 14 (L) 11/18/2022   NA 135 11/18/2022   K 4.1 11/18/2022   CL 101 11/18/2022   CREATININE 0.71 11/18/2022   BUN 13 11/18/2022   CO2 26 11/18/2022   HGBA1C 10.5 (H) 09/30/2022

## 2022-12-26 ENCOUNTER — Inpatient Hospital Stay: Payer: BC Managed Care – PPO | Attending: Hematology & Oncology | Admitting: Gynecologic Oncology

## 2022-12-26 ENCOUNTER — Telehealth: Payer: Self-pay | Admitting: *Deleted

## 2022-12-26 ENCOUNTER — Other Ambulatory Visit (HOSPITAL_COMMUNITY): Payer: Self-pay

## 2022-12-26 ENCOUNTER — Encounter: Payer: Self-pay | Admitting: Gynecologic Oncology

## 2022-12-26 ENCOUNTER — Telehealth: Payer: Self-pay | Admitting: Oncology

## 2022-12-26 ENCOUNTER — Other Ambulatory Visit: Payer: Self-pay

## 2022-12-26 ENCOUNTER — Telehealth: Payer: Self-pay | Admitting: Pulmonary Disease

## 2022-12-26 VITALS — BP 157/74 | HR 91 | Temp 98.3°F | Resp 16 | Ht 64.96 in | Wt 340.8 lb

## 2022-12-26 DIAGNOSIS — G4733 Obstructive sleep apnea (adult) (pediatric): Secondary | ICD-10-CM

## 2022-12-26 DIAGNOSIS — N8502 Endometrial intraepithelial neoplasia [EIN]: Secondary | ICD-10-CM | POA: Insufficient documentation

## 2022-12-26 DIAGNOSIS — E119 Type 2 diabetes mellitus without complications: Secondary | ICD-10-CM | POA: Insufficient documentation

## 2022-12-26 DIAGNOSIS — N939 Abnormal uterine and vaginal bleeding, unspecified: Secondary | ICD-10-CM

## 2022-12-26 DIAGNOSIS — Z8041 Family history of malignant neoplasm of ovary: Secondary | ICD-10-CM | POA: Insufficient documentation

## 2022-12-26 DIAGNOSIS — N840 Polyp of corpus uteri: Secondary | ICD-10-CM | POA: Insufficient documentation

## 2022-12-26 DIAGNOSIS — Z6841 Body Mass Index (BMI) 40.0 and over, adult: Secondary | ICD-10-CM | POA: Insufficient documentation

## 2022-12-26 MED ORDER — LEVONORGESTREL 20 MCG/DAY IU IUD
1.0000 | INTRAUTERINE_SYSTEM | Freq: Once | INTRAUTERINE | 0 refills | Status: AC
Start: 2022-12-26 — End: 2024-02-24
  Filled 2022-12-26 (×3): qty 1, 1d supply, fill #0

## 2022-12-26 NOTE — Patient Instructions (Signed)
Plan to return to the office in several weeks for Mirena IUD placement.  We will send this prescription to our outpatient pharmacy. You may be contacted about a copay if any for this. We will pick up the IUD and have it ready when you come to the office.

## 2022-12-26 NOTE — Telephone Encounter (Signed)
-----   Message from Doylene Bode, NP sent at 12/26/2022  9:57 AM EDT ----- I did send in a Mirena IUD to the [facility].  When ordering this, it said not reimbursed due to her insurance plan.  Can someone please call the [facility] outpatient pharmacy and check on the price?  If not covered then we need to start a prior authorization or try to get an exception with her insurance.

## 2022-12-26 NOTE — Telephone Encounter (Signed)
CASE ID: [id number]   [facility] states they recommend this pt have a HST instead of an attended sleep study. Please advise on how you would like to proceed.   P2P may be completed or change to HST order.   Thanks

## 2022-12-26 NOTE — Telephone Encounter (Signed)
-----   Message from Melissa D Cross, NP sent at 12/26/2022  9:57 AM EDT ----- I did send in a Mirena IUD to the [facility] outpatient pharmacy.  When ordering this, it said not reimbursed due to her insurance plan.  Can someone please call the [facility] outpatient pharmacy and check on the price?  If not covered then we need to start a prior authorization or try to get an exception with her insurance.  

## 2022-12-26 NOTE — Telephone Encounter (Addendum)
[employer] pharmacy benefits regarding Mirena IUD coverage.  They stated that it is not covered by patient's plan and does not need a prior authorization.  They transferred me to customer service to see if there was another type of IUD that would be covered or if an exception could be made.  Talked to the customer service agent and she was not able to provide any information about the patient's plan or provide any information on if an exception could be made.

## 2022-12-26 NOTE — Telephone Encounter (Addendum)
Called [employer]. States the IUD is a plan exclusion and is not covered by the patients insurance. The out of pocket expense for the IUD is $1,467.56. They will keep the IUD Rx. On file.   I will work on calling the [employer] company for prior authorization/exception due to product service not covered.   Spoke to Lecel L. BC/BS customer service professional in regards to patient's benefits for IUD. BC/BS rep stated the IUD is covered with  $50 co-pay but must be billed under medical and not pharmacy Rx. Reference # [id number].

## 2022-12-29 ENCOUNTER — Other Ambulatory Visit: Payer: Self-pay | Admitting: Family

## 2022-12-29 ENCOUNTER — Inpatient Hospital Stay: Payer: BC Managed Care – PPO

## 2022-12-29 ENCOUNTER — Inpatient Hospital Stay: Payer: BC Managed Care – PPO | Admitting: Family

## 2022-12-29 DIAGNOSIS — D72829 Elevated white blood cell count, unspecified: Secondary | ICD-10-CM

## 2022-12-29 DIAGNOSIS — D649 Anemia, unspecified: Secondary | ICD-10-CM

## 2022-12-30 NOTE — Telephone Encounter (Signed)
Ms. Hetzer called the office returning a call from Renaldo Reel, RN.  Pt advised to call her insurance company BC/BS in regards to the IUD (for treatment use not preventative) to ask for an exception because it's not a covered expense.   Pt also had some concerns that she has some bleeding today. Pt describes the bleeding as pink spots in her underwear. Pt states she had some bleeding after her procedure with Dr. Erin Fulling on 12/10/22 that lasted several days then it stopped. States it's been two weeks with no vaginal bleeding accept for today.   Pt denies anything was placed in the vagina, not passing clots. No vaginal discharge, no fever or chills. The pt was asked if she had been doing anything different before the bleeding started. She said, "I am cleaning my house and haven't followed the lifting restrictions"  Pt advised to stop any heavy lifting, rest and follow the instructions on her after visit summary and call Dr. Danne Harbor office with any increased bleeding or if the bleeding doesn't stop or with any concerns.    Pt also advised she can call our office with any concerns or questions and to follow up with the outcome of her call to BC/BS.

## 2022-12-30 NOTE — Telephone Encounter (Signed)
Left a message for Hannah Figueroa regarding IUD insurance coverage.  Requested a return call.

## 2023-01-08 ENCOUNTER — Other Ambulatory Visit: Payer: Self-pay | Admitting: Pulmonary Disease

## 2023-01-08 DIAGNOSIS — G4730 Sleep apnea, unspecified: Secondary | ICD-10-CM

## 2023-01-08 NOTE — Telephone Encounter (Signed)
Placed secure chat to Dr. Wynona Neat to see if he wants to order a HST for this patient.

## 2023-01-09 NOTE — Telephone Encounter (Signed)
LM for Ms Pizzi to call Dr. Winferd Humphrey office to see if she discussed an exception for the IUD to be covered as it is a treatment for endometrial intraepithelial neoplasia with her insurance company as noted below by [employer].

## 2023-01-14 ENCOUNTER — Telehealth: Payer: Self-pay

## 2023-01-14 NOTE — Telephone Encounter (Signed)
Called Hannah Figueroa's medical plan BC/BS to inquire on the status of an exception for a Mirena that doesn't require pre-authorization through her pharmacy plan because it's a plan exclusion and not covered.   Spoke with Hannah Figueroa. Intake specialist at [employer] who states that the Mirena is not a covered pharmacy benefit but can be billed to Patient's medical benefit through a preferred speciality pharmacy. From the patient-member policy through BC/BS plan [facility] was notified at [phone] and spoke with Hannah Figueroa the pharmacist. Orders given for Mirena and Hannah Figueroa states that we will be notified if the Mirena is covered within the next few days.   Received a call from Hannah Figueroa at [employer] after speaking with Hannah Figueroa to confirm patients information, and prescribing physician orders for Mirena. Hannah Figueroa states they should receive authorization for payment from BC/BS within 48 hours and that the office could follow up at [phone]. And that [facility] would reach out to the office once they have information from BC/BS.  Hannah Figueroa was notified of the above and advised patient that we would call her once we hear from [facility]. And at that time we will reschedule her appointment.

## 2023-01-14 NOTE — Telephone Encounter (Signed)
Called pt in regards to appt on 5/31 that needs to be canceled. Office is waiting on approval of IUD from insurance/pharmacy. Will reach out to pt once we get approval.  Pt verbalized understanding.

## 2023-01-16 ENCOUNTER — Inpatient Hospital Stay: Payer: BC Managed Care – PPO | Admitting: Gynecologic Oncology

## 2023-01-20 NOTE — Telephone Encounter (Signed)
Do you need me to place an order for HST?

## 2023-01-20 NOTE — Telephone Encounter (Signed)
Hannah Figueroa would like to proceed with an HST order, thanks

## 2023-01-20 NOTE — Addendum Note (Signed)
Addended by: Erick Blinks A on: 01/20/2023 03:44 PM   Modules accepted: Orders

## 2023-01-20 NOTE — Telephone Encounter (Signed)
Order for Hst has been placed

## 2023-01-21 ENCOUNTER — Telehealth: Payer: Self-pay | Admitting: *Deleted

## 2023-01-21 NOTE — Telephone Encounter (Signed)
Received a Fax from [employer] in regards to an est. Ship date of 6/10 and a [employer] arrival date of 6/11 for Ms. Haran's Mirena IUD arriving to the office.

## 2023-01-21 NOTE — Telephone Encounter (Addendum)
Left a message for Hannah Figueroa to give the office a call back at [phone] in regards to her IUD being covered and that [facility] will be reaching out to her for consent in the next 24-48 hours and once that happens we can reschedule her appointment.

## 2023-01-23 NOTE — Telephone Encounter (Signed)
Great news - can we get the patient rescheduled for a visit to insert the IUD?

## 2023-01-26 DIAGNOSIS — C541 Malignant neoplasm of endometrium: Secondary | ICD-10-CM | POA: Diagnosis not present

## 2023-01-26 DIAGNOSIS — N8502 Endometrial intraepithelial neoplasia [EIN]: Secondary | ICD-10-CM | POA: Diagnosis not present

## 2023-01-28 ENCOUNTER — Telehealth: Payer: Self-pay | Admitting: *Deleted

## 2023-01-28 ENCOUNTER — Other Ambulatory Visit: Payer: Self-pay | Admitting: Gynecologic Oncology

## 2023-01-28 DIAGNOSIS — N8502 Endometrial intraepithelial neoplasia [EIN]: Secondary | ICD-10-CM

## 2023-01-28 MED ORDER — MEDROXYPROGESTERONE ACETATE 10 MG PO TABS
10.0000 mg | ORAL_TABLET | Freq: Every day | ORAL | 1 refills | Status: DC
Start: 2023-01-28 — End: 2023-09-01

## 2023-01-28 NOTE — Telephone Encounter (Signed)
Spoke with Ms. Hannah Figueroa and she is aware of her appointment scheduled for July 11th. At 0800 for IUD placement with Dr. Pricilla Holm.  Message also relayed to patient from Dr. Pricilla Holm that a Rx. Was sent to her pharmacy for provera 10 mg once daily to take from now until IUD is placed, since it's been such a delay, she would like pt to take the progesterone orally for 4 weeks. Pt was receptive to instructions and thanked the office for calling and had no further concerns or questions at this time.

## 2023-01-28 NOTE — Telephone Encounter (Signed)
Attempted to reach Hannah Figueroa in regards to a new scheduled appointment on July 11th at 0800 with Dr. Pricilla Holm and IUD arrival. Left a voicemail for patient to call the office at [phone].

## 2023-01-28 NOTE — Telephone Encounter (Signed)
Please let the patient know I sent provera 10 mg q day for her to take from now until when IUD gets placed. Since its been such a delay, I'd like her to take progesterone orally for 4 weeks. She'll be able to stop it after IUD is placed. Thank you

## 2023-02-11 ENCOUNTER — Inpatient Hospital Stay: Payer: BC Managed Care – PPO

## 2023-02-11 ENCOUNTER — Other Ambulatory Visit: Payer: Self-pay | Admitting: Gynecologic Oncology

## 2023-02-11 ENCOUNTER — Inpatient Hospital Stay: Payer: BC Managed Care – PPO | Admitting: Genetic Counselor

## 2023-02-11 DIAGNOSIS — N8502 Endometrial intraepithelial neoplasia [EIN]: Secondary | ICD-10-CM

## 2023-02-12 NOTE — Telephone Encounter (Signed)
Could you please call her to see if she needs a refill on this or if it was pharmacy auto-requested? Thank you

## 2023-02-13 NOTE — Telephone Encounter (Signed)
2nd attempt to reach Hannah Figueroa in regards to her Rx. For provera. Left a voicemail requesting a call back.    Patient's pharmacy was called and they state the patient is requesting a 90 day supply on all her medications. [facility]vised [facility] that patient doesn't need a 90 day supply of provera. Patient will be following up with provider on July 11th. [facility] confirmed they will remove Provera Rx off the 90 day refill supply.

## 2023-02-18 ENCOUNTER — Other Ambulatory Visit: Payer: BC Managed Care – PPO

## 2023-02-18 ENCOUNTER — Encounter: Payer: BC Managed Care – PPO | Admitting: Genetic Counselor

## 2023-02-24 ENCOUNTER — Encounter: Payer: Self-pay | Admitting: Gynecologic Oncology

## 2023-02-25 NOTE — Progress Notes (Unsigned)
Gynecologic Oncology Return Clinic Visit  02/26/23  Reason for Visit: IUD placement  Treatment History: The patient was initially seen in October 2020 with daily postmenopausal bleeding for 3 years.  Pathology at that time showed both an endometrial polyp as well as an endocervical polyp without hyperplasia or malignancy.  Patient then represented in February of this year with continued daily bleeding although less heavy than it had been.   She was prescribed Megace at a couple of points previously but lost the prescription each time.   10/08/22: Endometrial biopsy performed in the office showed benign endometrial polyp with hyperplasia without atypia.  No malignancy. 10/08/2022: Pelvic ultrasound exam shows a uterus measuring 10.7 x 6.1 x 7.2 with a heterogeneous myometrium.  Hypoechoic soft tissue mass seen in the anterior right uterus measuring up to 5 cm, potentially an exophytic fibroid although incompletely characterized.  Endometrium measures 13 mm.  Neither ovary definitively seen. 12/08/2022: Patient underwent hysteroscopy with polypectomy, D&C.  Plan had been for endometrial ablation with the NovaSure.  Device would not fully open the cavity so endometrial ablation was abandoned. Endometrial curettage showed benign inactive endometrium, scant fragments of benign endometrial polyp, no hyperplasia or malignancy.  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  There are rare foci concerning for well-differentiated endometrial adenocarcinoma.   Her PMH is notable for DM2. Last hemoglocin A1c was 10.5%.  She notes that since starting her diet, her blood glucose has ranged between 118 and 130.  She is currently on metformin managed by her primary care provider.  Decision made to proceed with Mirena IUD placement for treatment while working on DM2 control, weight loss. Given delay in obtaining IUD, the patient was started on provera 10 mg every day on 01/28/23.  Interval  History: Doing well.  Denies any bleeding, pelvic pain, or cramping since I saw her.  Past Medical/Surgical History: Past Medical History:  Diagnosis Date   Anemia    Asthma 11/15/2015   hospitalized as a child, uses daily steroid, rare rescue inhaler use   Diabetes mellitus without complication (HCC)    Esophageal reflux 11/15/2015   Hypertension    Pneumonia     Past Surgical History:  Procedure Laterality Date   CESAREAN SECTION     x2   CHOLECYSTECTOMY     DILATATION & CURETTAGE/HYSTEROSCOPY WITH MYOSURE  12/10/2022   Procedure: DILATATION & CURETTAGE, HYSTEROSCOPY, POLYPECTOMY  WITH MYOSURE;  Surgeon: Willodean Rosenthal, MD;  Location: [facility] OR;  Service: Gynecology;;   HYSTEROSCOPY WITH NOVASURE  12/10/2022   Procedure: HYSTEROSCOPY WITH ATTEMPTED NOVASURE;  Surgeon: Willodean Rosenthal, MD;  Location: [facility] OR;  Service: Gynecology;;  DEVICE UNABLE TO REACH UTERINE CAVITY   TUBAL LIGATION      Family History  Problem Relation Age of Onset   Hypertension Mother    Diabetes Mother    Asthma Father    Diabetes Father    Heart attack Father    Ovarian cancer Sister        30 years ago, died at age 41   Colon cancer Neg Hx    Breast cancer Neg Hx    Endometrial cancer Neg Hx    Pancreatic cancer Neg Hx    Prostate cancer Neg Hx     Social History   Socioeconomic History   Marital status: Married    Spouse name: Not on file   Number of children: Not on file   Years of education: Not on file   Highest  education level: Not on file  Occupational History   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  Tobacco Use   Smoking status: Former    Current packs/day: 0.00    Types: Cigarettes    Start date: 1990    Quit date: 1995    Years since quitting: 29.5   Smokeless tobacco: Never   Tobacco comments:    Quit in early 30's  Vaping Use   Vaping status: Never Used  Substance and Sexual Activity   Alcohol use: No    Comment: soc   Drug use: No    Sexual activity: Yes    Birth control/protection: Surgical  Other Topics Concern   Not on file  Social History Narrative   Not on file   Social Determinants of Health   Financial Resource Strain: Not on file  Food Insecurity: No Food Insecurity (11/18/2022)   Hunger Vital Sign    Worried About Running Out of Food in the Last Year: Never true    Ran Out of Food in the Last Year: Never true  Transportation Needs: No Transportation Needs (11/18/2022)   PRAPARE - Administrator, Civil Service (Medical): No    Lack of Transportation (Non-Medical): No  Physical Activity: Not on file  Stress: Not on file  Social Connections: Not on file    Current Medications:  Current Outpatient Medications:    amLODipine (NORVASC) 10 MG tablet, Take 1 tablet (10 mg total) by mouth daily., Disp: 30 tablet, Rfl: 11   Blood Glucose Monitoring Suppl (ACCU-CHEK AVIVA PLUS) w/Device KIT, Check blood sugar BID  one time early morning fasting and second time after lunch or dinner., Disp: 1 kit, Rfl: 0   glucose blood (ACCU-CHEK AVIVA PLUS) test strip, Check blood sugar BID  one time early morning fasting and second time after lunch or dinner., Disp: 100 each, Rfl: 12   Iron, Ferrous Sulfate, 325 (65 Fe) MG TABS, Take 325 mg by mouth daily., Disp: 30 tablet, Rfl: 1   Lancets (ACCU-CHEK MULTICLIX) lancets, Check blood sugar BID  one time early morning fasting and second time after lunch or dinner., Disp: 100 each, Rfl: 12   losartan (COZAAR) 100 MG tablet, Take 1 tablet (100 mg total) by mouth daily., Disp: 90 tablet, Rfl: 3   medroxyPROGESTERone (PROVERA) 10 MG tablet, Take 1 tablet (10 mg total) by mouth daily., Disp: 30 tablet, Rfl: 1   melatonin 3 MG TABS tablet, Take 6 mg by mouth at bedtime., Disp: , Rfl:    metFORMIN (GLUCOPHAGE) 500 MG tablet, Take 1 tablet (500 mg total) by mouth 2 (two) times daily with a meal., Disp: 60 tablet, Rfl: 3   atorvastatin (LIPITOR) 10 MG tablet, Take 1 tablet (10 mg  total) by mouth daily. (Patient not taking: Reported on 02/24/2023), Disp: 90 tablet, Rfl: 3   levonorgestrel (MIRENA) 20 MCG/DAY IUD, 1 each by Intrauterine route once for 1 dose., Disp: 1 each, Rfl: 0  Review of Systems: Denies appetite changes, fevers, chills, fatigue, unexplained weight changes. Denies hearing loss, neck lumps or masses, mouth sores, ringing in ears or voice changes. Denies cough or wheezing.  Denies shortness of breath. Denies chest pain or palpitations. Denies leg swelling. Denies abdominal distention, pain, blood in stools, constipation, diarrhea, nausea, vomiting, or early satiety. Denies pain with intercourse, dysuria, frequency, hematuria or incontinence. Denies hot flashes, pelvic pain, vaginal bleeding or vaginal discharge.   Denies joint pain, back pain or muscle pain/cramps. Denies itching, rash, or wounds.  Denies dizziness, headaches, numbness or seizures. Denies swollen lymph nodes or glands, denies easy bruising or bleeding. Denies anxiety, depression, confusion, or decreased concentration.  Physical Exam: BP (!) 165/73 (BP Location: Left Arm, Patient Position: Sitting) Comment: MD notified pt. stated she didn't take her bp meds this morning  Pulse 84   Temp 97.9 F (36.6 C) (Oral)   Resp 17   Wt (!) 324 lb 12.8 oz (147.3 kg)   SpO2 99%   BMI 54.11 kg/m  General: Alert, oriented, no acute distress. HEENT: Atraumatic, normocephalic, sclera anicteric. Chest: Clear to auscultation bilaterally.  No wheezes or rhonchi. Cardiovascular: Regular rate and rhythm, no murmurs. Abdomen: Obese, soft, nontender.  Normoactive bowel sounds.  No masses or hepatosplenomegaly appreciated.    GU: Normal appearing external genitalia without erythema, excoriation, or lesions.  Speculum exam reveals normal-appearing cervix, no vaginal lesions.  Bimanual exam reveals mildly enlarged mobile uterus.    IUD insertion procedure Preoperative diagnosis: EIN Postoperative  diagnosis: Same as above Physician: Pricilla Holm MD Estimated blood loss: Minimal Specimens: none Procedure: After the procedure was discussed with the patient including risks and benefits, she gave consent.  The patient endorsed some feelings concerning for vasovagal reaction as we were moving a table.  She was then placed in dorsolithotomy position and a speculum was placed in the vagina.  Once the cervix was well visualized it was cleansed with Betadine x3.  An endometrial Pipelle was then passed to a depth of just over 8 cm.  The patient was quite uncomfortable and appeared to be having a vasovagal reaction at this time.  The endometrial Pipelle was removed.  We discussed attempt after some lidocaine injection versus planning to do this as an outpatient procedure under monitored anesthesia.  All instruments were removed from the vagina.Given patient's initial symptoms once started prior to the exam, I suggested the latter.  She was amenable to this.   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  Attempted Mirena IUD placement today.  Given vasovagal reaction, we will plan to do this as an outpatient procedure in the OR.  Will plan for repeat endometrial biopsy at the same time given that it has been almost 3 months since her initial biopsy and she has been on progesterone treatment for prior to that time.  Will plan for hysteroscopy, endometrial biopsy versus D&C, Mirena IUD insertion.  We discussed the risks of surgery including but not limited to bleeding, need for blood transfusion, infection, damage to surrounding structures, need for additional procedures, VTE.  Discussed continued work on glycemic control, weight loss.  Patient was congratulated on her 16 pounds of weight loss since her last visit with me.  22 minutes of total time was spent for this patient encounter, including preparation, face-to-face  counseling with the patient and coordination of care, and documentation of the encounter.  Eugene Garnet, MD  Division of Gynecologic Oncology  Department of Obstetrics and Gynecology  [facility]

## 2023-02-25 NOTE — H&P (View-Only) (Signed)
 Gynecologic Oncology Return Clinic Visit  02/26/23  Reason for Visit: IUD placement  Treatment History: The patient was initially seen in October 2020 with daily postmenopausal bleeding for 3 years.  Pathology at that time showed both an endometrial polyp as well as an endocervical polyp without hyperplasia or malignancy.  Patient then represented in February of this year with continued daily bleeding although less heavy than it had been.   She was prescribed Megace at a couple of points previously but lost the prescription each time.   10/08/22: Endometrial biopsy performed in the office showed benign endometrial polyp with hyperplasia without atypia.  No malignancy. 10/08/2022: Pelvic ultrasound exam shows a uterus measuring 10.7 x 6.1 x 7.2 with a heterogeneous myometrium.  Hypoechoic soft tissue mass seen in the anterior right uterus measuring up to 5 cm, potentially an exophytic fibroid although incompletely characterized.  Endometrium measures 13 mm.  Neither ovary definitively seen. 12/08/2022: Patient underwent hysteroscopy with polypectomy, D&C.  Plan had been for endometrial ablation with the NovaSure.  Device would not fully open the cavity so endometrial ablation was abandoned. Endometrial curettage showed benign inactive endometrium, scant fragments of benign endometrial polyp, no hyperplasia or malignancy.  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  There are rare foci concerning for well-differentiated endometrial adenocarcinoma.   Her PMH is notable for DM2. Last hemoglocin A1c was 10.5%.  She notes that since starting her diet, her blood glucose has ranged between 118 and 130.  She is currently on metformin managed by her primary care provider.  Decision made to proceed with Mirena IUD placement for treatment while working on DM2 control, weight loss. Given delay in obtaining IUD, the patient was started on provera 10 mg every day on 01/28/23.  Interval  History: Doing well.  Denies any bleeding, pelvic pain, or cramping since I saw her.  Past Medical/Surgical History: Past Medical History:  Diagnosis Date   Anemia    Asthma 11/15/2015   hospitalized as a child, uses daily steroid, rare rescue inhaler use   Diabetes mellitus without complication (HCC)    Esophageal reflux 11/15/2015   Hypertension    Pneumonia     Past Surgical History:  Procedure Laterality Date   CESAREAN SECTION     x2   CHOLECYSTECTOMY     DILATATION & CURETTAGE/HYSTEROSCOPY WITH MYOSURE  12/10/2022   Procedure: DILATATION & CURETTAGE, HYSTEROSCOPY, POLYPECTOMY  WITH MYOSURE;  Surgeon: Willodean Rosenthal, MD;  Location: [facility] OR;  Service: Gynecology;;   HYSTEROSCOPY WITH NOVASURE  12/10/2022   Procedure: HYSTEROSCOPY WITH ATTEMPTED NOVASURE;  Surgeon: Willodean Rosenthal, MD;  Location: [facility] OR;  Service: Gynecology;;  DEVICE UNABLE TO REACH UTERINE CAVITY   TUBAL LIGATION      Family History  Problem Relation Age of Onset   Hypertension Mother    Diabetes Mother    Asthma Father    Diabetes Father    Heart attack Father    Ovarian cancer Sister        30 years ago, died at age 41   Colon cancer Neg Hx    Breast cancer Neg Hx    Endometrial cancer Neg Hx    Pancreatic cancer Neg Hx    Prostate cancer Neg Hx     Social History   Socioeconomic History   Marital status: Married    Spouse name: Not on file   Number of children: Not on file   Years of education: Not on file   Highest  education level: Not on file  Occupational History   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  Tobacco Use   Smoking status: Former    Current packs/day: 0.00    Types: Cigarettes    Start date: 1990    Quit date: 1995    Years since quitting: 29.5   Smokeless tobacco: Never   Tobacco comments:    Quit in early 30's  Vaping Use   Vaping status: Never Used  Substance and Sexual Activity   Alcohol use: No    Comment: soc   Drug use: No    Sexual activity: Yes    Birth control/protection: Surgical  Other Topics Concern   Not on file  Social History Narrative   Not on file   Social Determinants of Health   Financial Resource Strain: Not on file  Food Insecurity: No Food Insecurity (11/18/2022)   Hunger Vital Sign    Worried About Running Out of Food in the Last Year: Never true    Ran Out of Food in the Last Year: Never true  Transportation Needs: No Transportation Needs (11/18/2022)   PRAPARE - Administrator, Civil Service (Medical): No    Lack of Transportation (Non-Medical): No  Physical Activity: Not on file  Stress: Not on file  Social Connections: Not on file    Current Medications:  Current Outpatient Medications:    amLODipine (NORVASC) 10 MG tablet, Take 1 tablet (10 mg total) by mouth daily., Disp: 30 tablet, Rfl: 11   Blood Glucose Monitoring Suppl (ACCU-CHEK AVIVA PLUS) w/Device KIT, Check blood sugar BID  one time early morning fasting and second time after lunch or dinner., Disp: 1 kit, Rfl: 0   glucose blood (ACCU-CHEK AVIVA PLUS) test strip, Check blood sugar BID  one time early morning fasting and second time after lunch or dinner., Disp: 100 each, Rfl: 12   Iron, Ferrous Sulfate, 325 (65 Fe) MG TABS, Take 325 mg by mouth daily., Disp: 30 tablet, Rfl: 1   Lancets (ACCU-CHEK MULTICLIX) lancets, Check blood sugar BID  one time early morning fasting and second time after lunch or dinner., Disp: 100 each, Rfl: 12   losartan (COZAAR) 100 MG tablet, Take 1 tablet (100 mg total) by mouth daily., Disp: 90 tablet, Rfl: 3   medroxyPROGESTERone (PROVERA) 10 MG tablet, Take 1 tablet (10 mg total) by mouth daily., Disp: 30 tablet, Rfl: 1   melatonin 3 MG TABS tablet, Take 6 mg by mouth at bedtime., Disp: , Rfl:    metFORMIN (GLUCOPHAGE) 500 MG tablet, Take 1 tablet (500 mg total) by mouth 2 (two) times daily with a meal., Disp: 60 tablet, Rfl: 3   atorvastatin (LIPITOR) 10 MG tablet, Take 1 tablet (10 mg  total) by mouth daily. (Patient not taking: Reported on 02/24/2023), Disp: 90 tablet, Rfl: 3   levonorgestrel (MIRENA) 20 MCG/DAY IUD, 1 each by Intrauterine route once for 1 dose., Disp: 1 each, Rfl: 0  Review of Systems: Denies appetite changes, fevers, chills, fatigue, unexplained weight changes. Denies hearing loss, neck lumps or masses, mouth sores, ringing in ears or voice changes. Denies cough or wheezing.  Denies shortness of breath. Denies chest pain or palpitations. Denies leg swelling. Denies abdominal distention, pain, blood in stools, constipation, diarrhea, nausea, vomiting, or early satiety. Denies pain with intercourse, dysuria, frequency, hematuria or incontinence. Denies hot flashes, pelvic pain, vaginal bleeding or vaginal discharge.   Denies joint pain, back pain or muscle pain/cramps. Denies itching, rash, or wounds.  Denies dizziness, headaches, numbness or seizures. Denies swollen lymph nodes or glands, denies easy bruising or bleeding. Denies anxiety, depression, confusion, or decreased concentration.  Physical Exam: BP (!) 165/73 (BP Location: Left Arm, Patient Position: Sitting) Comment: MD notified pt. stated she didn't take her bp meds this morning  Pulse 84   Temp 97.9 F (36.6 C) (Oral)   Resp 17   Wt (!) 324 lb 12.8 oz (147.3 kg)   SpO2 99%   BMI 54.11 kg/m  General: Alert, oriented, no acute distress. HEENT: Atraumatic, normocephalic, sclera anicteric. Chest: Clear to auscultation bilaterally.  No wheezes or rhonchi. Cardiovascular: Regular rate and rhythm, no murmurs. Abdomen: Obese, soft, nontender.  Normoactive bowel sounds.  No masses or hepatosplenomegaly appreciated.    GU: Normal appearing external genitalia without erythema, excoriation, or lesions.  Speculum exam reveals normal-appearing cervix, no vaginal lesions.  Bimanual exam reveals mildly enlarged mobile uterus.    IUD insertion procedure Preoperative diagnosis: EIN Postoperative  diagnosis: Same as above Physician: Pricilla Holm MD Estimated blood loss: Minimal Specimens: none Procedure: After the procedure was discussed with the patient including risks and benefits, she gave consent.  The patient endorsed some feelings concerning for vasovagal reaction as we were moving a table.  She was then placed in dorsolithotomy position and a speculum was placed in the vagina.  Once the cervix was well visualized it was cleansed with Betadine x3.  An endometrial Pipelle was then passed to a depth of just over 8 cm.  The patient was quite uncomfortable and appeared to be having a vasovagal reaction at this time.  The endometrial Pipelle was removed.  We discussed attempt after some lidocaine injection versus planning to do this as an outpatient procedure under monitored anesthesia.  All instruments were removed from the vagina.Given patient's initial symptoms once started prior to the exam, I suggested the latter.  She was amenable to this.   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  Attempted Mirena IUD placement today.  Given vasovagal reaction, we will plan to do this as an outpatient procedure in the OR.  Will plan for repeat endometrial biopsy at the same time given that it has been almost 3 months since her initial biopsy and she has been on progesterone treatment for prior to that time.  Will plan for hysteroscopy, endometrial biopsy versus D&C, Mirena IUD insertion.  We discussed the risks of surgery including but not limited to bleeding, need for blood transfusion, infection, damage to surrounding structures, need for additional procedures, VTE.  Discussed continued work on glycemic control, weight loss.  Patient was congratulated on her 16 pounds of weight loss since her last visit with me.  22 minutes of total time was spent for this patient encounter, including preparation, face-to-face  counseling with the patient and coordination of care, and documentation of the encounter.  Eugene Garnet, MD  Division of Gynecologic Oncology  Department of Obstetrics and Gynecology  [facility]

## 2023-02-26 ENCOUNTER — Other Ambulatory Visit: Payer: Self-pay

## 2023-02-26 ENCOUNTER — Inpatient Hospital Stay: Payer: BC Managed Care – PPO | Admitting: Gynecologic Oncology

## 2023-02-26 ENCOUNTER — Inpatient Hospital Stay: Payer: BC Managed Care – PPO | Attending: Hematology & Oncology | Admitting: Gynecologic Oncology

## 2023-02-26 ENCOUNTER — Encounter: Payer: Self-pay | Admitting: Gynecologic Oncology

## 2023-02-26 VITALS — BP 165/73 | HR 84 | Temp 97.9°F | Resp 17 | Wt 324.8 lb

## 2023-02-26 DIAGNOSIS — N8502 Endometrial intraepithelial neoplasia [EIN]: Secondary | ICD-10-CM | POA: Diagnosis not present

## 2023-02-26 DIAGNOSIS — R55 Syncope and collapse: Secondary | ICD-10-CM | POA: Diagnosis not present

## 2023-02-26 DIAGNOSIS — Z7984 Long term (current) use of oral hypoglycemic drugs: Secondary | ICD-10-CM | POA: Insufficient documentation

## 2023-02-26 DIAGNOSIS — Z6841 Body Mass Index (BMI) 40.0 and over, adult: Secondary | ICD-10-CM | POA: Insufficient documentation

## 2023-02-26 DIAGNOSIS — E119 Type 2 diabetes mellitus without complications: Secondary | ICD-10-CM | POA: Diagnosis not present

## 2023-02-26 NOTE — Progress Notes (Signed)
Patient here for follow up with Dr. Pricilla Holm and for a pre-operative appointment prior to her scheduled surgery on March 04, 2023. She is scheduled for EUA, D&C hysteroscopy with possible myosure, IUD placement. The surgery was discussed in detail.  See after visit summary for additional details.    Discussed measures to take at home to prevent DVT including frequent mobility.  Reportable signs and symptoms of DVT discussed. Post-operative instructions discussed and expectations for after surgery.     5 minutes spent with the patient.  Verbalizing understanding of material discussed. No needs or concerns voiced at the end of the visit.   Advised patient to call for any needs.    This appointment is included in the global surgical bundle as pre-operative teaching and has no charge.

## 2023-02-26 NOTE — Patient Instructions (Signed)
Preparing for your Surgery  Plan for surgery on March 04, 2023 with Dr. Eugene Garnet at [facility]. You will be scheduled for examination under anesthesia, dilation and curettage of the uterus with hysteroscopy (looking with a camera), Mirena IUD placement.   Pre-operative Testing -You will receive a phone call from presurgical testing at [facility] to discuss surgery instructions and arrange for lab work if needed.  -Bring your insurance card, copy of an advanced directive if applicable, medication list.  -You should not be taking blood thinners or aspirin at least ten days prior to surgery unless instructed by your surgeon.  -Do not take supplements such as fish oil (omega 3), red yeast rice, turmeric before your surgery. You want to avoid medications with aspirin in them including headache powders such as BC or Goody's), Excedrin migraine.  Day Before Surgery at Home -You will be advised you can have clear liquids up until 3 hours before your surgery.    Your role in recovery Your role is to become active as soon as directed by your doctor, while still giving yourself time to heal.  Rest when you feel tired. You will be asked to do the following in order to speed your recovery:  - Cough and breathe deeply. This helps to clear and expand your lungs and can prevent pneumonia after surgery.  - STAY ACTIVE WHEN YOU GET HOME. Do mild physical activity. Walking or moving your legs help your circulation and body functions return to normal. Do not try to get up or walk alone the first time after surgery.   -If you develop swelling on one leg or the other, pain in the back of your leg, redness/warmth in one of your legs, please call the office or go to the Emergency Room to have a doppler to rule out a blood clot. For shortness of breath, chest pain-seek care in the Emergency Room as soon as possible. - Actively manage your pain. Managing your pain lets you move in comfort. We  will ask you to rate your pain on a scale of zero to 10. It is your responsibility to tell your doctor or nurse where and how much you hurt so your pain can be treated.  Special Considerations -Your final pathology results from surgery should be available around one week after surgery and the results will be relayed to you when available.  -FMLA forms can be faxed to [phone] and please allow 5-7 business days for completion.  Pain Management After Surgery -Make sure that you have Tylenol and Ibuprofen at home IF YOU ARE ABLE TO TAKE THESE MEDICATIONs to use on a regular basis after surgery for pain control. We recommend alternating the medications every hour to six hours since they work differently and are processed in the body differently for pain relief.  -Review the attached handout on narcotic use and their risks and side effects.   Bowel Regimen -It is important to prevent constipation and drink adequate amounts of liquids.   Risks of Surgery Risks of surgery are low but include bleeding, infection, damage to surrounding structures, re-operation, blood clots, and very rarely death.  AFTER SURGERY INSTRUCTIONS  Return to work:  1-2 days if applicable  Activity: 1. Be up and out of the bed during the day.  Take a nap if needed.  You may walk up steps but be careful and use the hand rail.  Stair climbing will tire you more than you think, you may need to  stop part way and rest.   2. No lifting or straining for 2 weeks over 10 pounds. No pushing, pulling, straining for 2 weeks.  3. No driving for minimum 24 hours after surgery.  Do not drive if you are taking narcotic pain medicine and make sure that your reaction time has returned.   4. You can shower as soon as the next day after surgery. Shower daily. No tub baths or submerging your body in water until cleared by your surgeon. If you have the soap that was given to you by pre-surgical testing that was used before surgery, you do  not need to use it afterwards because this can irritate your incisions.   5. No sexual activity and nothing in the vagina for 2 weeks minimum.  6. You may experience vaginal spotting and discharge after surgery.  The spotting is normal but if you experience heavy bleeding, call our office.  7. Take Tylenol or ibuprofen first for pain if you are able to take these medications. Monitor your Tylenol intake to a max of 4,000 mg in a 24 hour period. You can alternate these medications after surgery.  Diet: 1. Low sodium Heart Healthy Diet is recommended but you are cleared to resume your normal (before surgery) diet after your procedure.  2. It is safe to use a laxative, such as Miralax or Colace, if you have difficulty moving your bowels.   Wound Care: 1. Keep clean and dry.  Shower daily.  Reasons to call the Doctor: Fever - Oral temperature greater than 100.4 degrees Fahrenheit Foul-smelling vaginal discharge Difficulty urinating Nausea and vomiting Increased pain at the site of the incision that is unrelieved with pain medicine. Difficulty breathing with or without chest pain New calf pain especially if only on one side Sudden, continuing increased vaginal bleeding with or without clots.   Contacts: For questions or concerns you should contact:  Dr. Eugene Garnet at [phone]  Warner Mccreedy, NP at [phone]  After Hours: call [phone] and have the GYN Oncologist paged/contacted (after 5 pm or on the weekends).  Messages sent via mychart are for non-urgent matters and are not responded to after hours so for urgent needs, please call the after hours number.

## 2023-02-27 NOTE — Patient Instructions (Addendum)
SURGICAL WAITING ROOM VISITATION  Patients having surgery or a procedure may have no more than 2 support people in the waiting area - these visitors may rotate.    Children under the age of 50 must have an adult with them who is not the patient.  Due to an increase in RSV and influenza rates and associated hospitalizations, children ages 24 and under may not visit patients in [facility] hospitals.  If the patient needs to stay at the hospital during part of their recovery, the visitor guidelines for inpatient rooms apply. Pre-op nurse will coordinate an appropriate time for 1 support person to accompany patient in pre-op.  This support person may not rotate.    Please refer to the [facility] website for the visitor guidelines for Inpatients (after your surgery is over and you are in a regular room).       Your procedure is scheduled on: 03/04/23   Report to [facility] Main Entrance    Report to admitting at 6:15 AM   Call this number if you have problems the morning of surgery [phone]   Do not eat food :After Midnight.   After Midnight you may have the following liquids until 5:30 AM DAY OF SURGERY  Water Non-Citrus Juices (without pulp, NO RED-Apple, White grape, White cranberry) Black Coffee (NO MILK/CREAM OR CREAMERS, sugar ok)  Clear Tea (NO MILK/CREAM OR CREAMERS, sugar ok) regular and decaf                             Plain Jell-O (NO RED)                                           Fruit ices (not with fruit pulp, NO RED)                                     Popsicles (NO RED)                                                               Sports drinks like Gatorade (NO RED)                   Oral Hygiene is also important to reduce your risk of infection.                                    Remember - BRUSH YOUR TEETH THE MORNING OF SURGERY WITH YOUR REGULAR TOOTHPASTE  DENTURES WILL BE REMOVED PRIOR TO SURGERY PLEASE DO NOT APPLY "Poly grip" OR  ADHESIVES!!!   Do NOT smoke after Midnight   Take these medicines the morning of surgery: Amlodipine, Provera  DO NOT TAKE ANY ORAL DIABETIC MEDICATIONS DAY OF YOUR SURGERY- HOLD METFORMIN THE DAY OF SURGERY  Bring CPAP mask and tubing day of surgery.  You may not have any metal on your body including hair pins, jewelry, and body piercing             Do not wear make-up, lotions, powders, perfumes or deodorant  Do not wear nail polish including gel and S&S, artificial/acrylic nails, or any other type of covering on natural nails including finger and toenails. If you have artificial nails, gel coating, etc. that needs to be removed by a nail salon please have this removed prior to surgery or surgery may need to be canceled/ delayed if the surgeon/ anesthesia feels like they are unable to be safely monitored.   Do not shave  48 hours prior to surgery.    Do not bring valuables to the hospital. [facility] IS NOT             RESPONSIBLE   FOR VALUABLES.   Contacts, glasses, dentures or bridgework may not be worn into surgery.   Bring small overnight bag day of surgery.   DO NOT BRING YOUR HOME MEDICATIONS TO THE HOSPITAL. PHARMACY WILL DISPENSE MEDICATIONS LISTED ON YOUR MEDICATION LIST TO YOU DURING YOUR ADMISSION IN THE HOSPITAL!    Patients discharged on the day of surgery will not be allowed to drive home.  Someone NEEDS to stay with you for the first 24 hours after anesthesia.   Special Instructions: Bring a copy of your healthcare power of attorney and living will documents the day of surgery if you haven't scanned them before.              Please read over the following fact sheets you were given: IF YOU HAVE QUESTIONS ABOUT YOUR PRE-OP INSTRUCTIONS PLEASE CALL [phone]   If you received a COVID test during your pre-op visit  it is requested that you wear a mask when out in public, stay away from anyone that may not be feeling well and notify  your surgeon if you develop symptoms. If you test positive for Covid or have been in contact with anyone that has tested positive in the last 10 days please notify you surgeon.    [facility] - Preparing for Surgery Before surgery, you can play an important role.  Because skin is not sterile, your skin needs to be as free of germs as possible.  You can reduce the number of germs on your skin by washing with CHG (chlorahexidine gluconate) soap before surgery.  CHG is an antiseptic cleaner which kills germs and bonds with the skin to continue killing germs even after washing. Please DO NOT use if you have an allergy to CHG or antibacterial soaps.  If your skin becomes reddened/irritated stop using the CHG and inform your nurse when you arrive at Short Stay. Do not shave (including legs and underarms) for at least 48 hours prior to the first CHG shower.  You may shave your face/neck.  Please follow these instructions carefully:  1.  Shower with CHG Soap the night before surgery and the  morning of surgery.  2.  If you choose to wash your hair, wash your hair first as usual with your normal  shampoo.  3.  After you shampoo, rinse your hair and body thoroughly to remove the shampoo.                             4.  Use CHG as you would any other liquid soap.  You can apply chg directly to  the skin and wash.  Gently with a scrungie or clean washcloth.  5.  Apply the CHG Soap to your body ONLY FROM THE NECK DOWN.   Do   not use on face/ open                           Wound or open sores. Avoid contact with eyes, ears mouth and   genitals (private parts).                       Wash face,  Genitals (private parts) with your normal soap.             6.  Wash thoroughly, paying special attention to the area where your    surgery  will be performed.  7.  Thoroughly rinse your body with warm water from the neck down.  8.  DO NOT shower/wash with your normal soap after using and rinsing off the CHG Soap.                 9.  Pat yourself dry with a clean towel.            10.  Wear clean pajamas.            11.  Place clean sheets on your bed the night of your first shower and do not  sleep with pets. Day of Surgery : Do not apply any lotions/deodorants the morning of surgery.  Please wear clean clothes to the hospital/surgery center.  FAILURE TO FOLLOW THESE INSTRUCTIONS MAY RESULT IN THE CANCELLATION OF YOUR SURGERY  _How TO Manage Diabetes Before and After Surgery  Why is it important to control my blood sugar before and after surgery? Improving blood sugar levels before and after surgery helps healing and can limit problems. A way of improving blood sugar control is eating a healthy diet by:  Eating less sugar and carbohydrates  Increasing activity/exercise  Talking with your doctor about reaching your blood sugar goals High blood sugars (greater than 180 mg/dL) can raise your risk of infections and slow your recovery, so you will need to focus on controlling your diabetes during the weeks before surgery. Make sure that the doctor who takes care of your diabetes knows about your planned surgery including the date and location.  How do I manage my blood sugar before surgery? Check your blood sugar at least 4 times a day, starting 2 days before surgery, to make sure that the level is not too high or low. Check your blood sugar the morning of your surgery when you wake up and every 2 hours until you get to the Short Stay unit. If your blood sugar is less than 70 mg/dL, you will need to treat for low blood sugar: Do not take insulin. Treat a low blood sugar (less than 70 mg/dL) with  cup of clear juice (cranberry or apple), 4 glucose tablets, OR glucose gel. Recheck blood sugar in 15 minutes after treatment (to make sure it is greater than 70 mg/dL). If your blood sugar is not greater than 70 mg/dL on recheck, call [phone] for further instructions. Report your blood sugar to the short stay nurse  when you get to Short Stay.  If you are admitted to the hospital after surgery: Your blood sugar will be checked by the staff and you will probably be given insulin after surgery (instead of oral diabetes medicines) to make  sure you have good blood sugar levels. The goal for blood sugar control after surgery is 80-180 mg/dL.   WHAT DO I DO ABOUT MY DIABETES MEDICATION?  Do not take oral diabetes medicines (pills) the morning of surgery. Hold Metformin the day of surgery.  THE NIGHT BEFORE SURGERY, take     units of       insulin.       THE MORNING OF SURGERY, take   units of         insulin.  DO NOT TAKE THE FOLLOWING 7 DAYS PRIOR TO SURGERY: Ozempic, Wegovy, Rybelsus (Semaglutide), Byetta (exenatide), Bydureon (exenatide ER), Victoza, Saxenda (liraglutide), or Trulicity (dulaglutide) Mounjaro (Tirzepatide) Adlyxin (Lixisenatide), Polyethylene Glycol Loxenatide.  Patient Signature:  Date:   Nurse Signature:  Date:

## 2023-02-27 NOTE — Progress Notes (Signed)
COVID Vaccine received:  []  No []  Yes Date of any COVID positive Test in last 90 days:  PCP - Esperanza Richters PA-C Cardiologist -   Chest x-ray -  EKG -  11/03/22 Epic Stress Test -  ECHO -  Cardiac Cath -   Bowel Prep - []  No  []   Yes ______  Pacemaker / ICD device []  No []  Yes   Spinal Cord Stimulator:[]  No []  Yes       History of Sleep Apnea? []  No [x]  Yes   CPAP used?- []  No []  Yes    Does the patient monitor blood sugar?          []  No []  Yes  []  N/A  Patient has: []  NO Hx DM   []  Pre-DM                 []  DM1  [x]   DM2 Does patient have a [employer] or Dexacom? []  No []  Yes   Fasting Blood Sugar Ranges-  Checks Blood Sugar _____ times a day  GLP1 agonist / usual dose -  GLP1 instructions:  SGLT-2 inhibitors / usual dose -  SGLT-2 instructions:   Blood Thinner / Instructions: Aspirin Instructions:  Comments:   Activity level: Patient is able / unable to climb a flight of stairs without difficulty; []  No CP  []  No SOB, but would have ___   Patient can / can not perform ADLs without assistance.   Anesthesia review:   Patient denies shortness of breath, fever, cough and chest pain at PAT appointment.  Patient verbalized understanding and agreement to the Pre-Surgical Instructions that were given to them at this PAT appointment. Patient was also educated of the need to review these PAT instructions again prior to his/her surgery.I reviewed the appropriate phone numbers to call if they have any and questions or concerns.

## 2023-03-02 ENCOUNTER — Inpatient Hospital Stay (HOSPITAL_COMMUNITY)
Admission: RE | Admit: 2023-03-02 | Discharge: 2023-03-02 | Disposition: A | Discharge status: Home / Self Care | Payer: BC Managed Care – PPO | Source: Ambulatory Visit

## 2023-03-02 ENCOUNTER — Telehealth: Payer: Self-pay | Admitting: Surgery

## 2023-03-02 DIAGNOSIS — Z01818 Encounter for other preprocedural examination: Secondary | ICD-10-CM

## 2023-03-02 DIAGNOSIS — I10 Essential (primary) hypertension: Secondary | ICD-10-CM

## 2023-03-02 DIAGNOSIS — E119 Type 2 diabetes mellitus without complications: Secondary | ICD-10-CM

## 2023-03-02 NOTE — Telephone Encounter (Signed)
Called patient to see if she is able to come for Pre-admission appointment tomorrow at 8am. Patient states she can come tomorrow but she cannot come at 8am, she is available between 9am-12pm. Reached out to pre-admission to see what times are available. Patient advised she will be notified once we have a time for her appointment.

## 2023-03-03 ENCOUNTER — Encounter (HOSPITAL_COMMUNITY): Payer: Self-pay

## 2023-03-03 ENCOUNTER — Other Ambulatory Visit: Payer: Self-pay

## 2023-03-03 ENCOUNTER — Encounter (HOSPITAL_COMMUNITY)
Admission: RE | Admit: 2023-03-03 | Discharge: 2023-03-03 | Disposition: A | Discharge status: Home / Self Care | Payer: BC Managed Care – PPO | Source: Ambulatory Visit | Attending: Gynecologic Oncology | Admitting: Gynecologic Oncology

## 2023-03-03 ENCOUNTER — Telehealth: Payer: Self-pay | Admitting: *Deleted

## 2023-03-03 DIAGNOSIS — E119 Type 2 diabetes mellitus without complications: Secondary | ICD-10-CM

## 2023-03-03 DIAGNOSIS — Z01818 Encounter for other preprocedural examination: Secondary | ICD-10-CM

## 2023-03-03 DIAGNOSIS — I10 Essential (primary) hypertension: Secondary | ICD-10-CM | POA: Insufficient documentation

## 2023-03-03 DIAGNOSIS — E669 Obesity, unspecified: Secondary | ICD-10-CM | POA: Diagnosis not present

## 2023-03-03 DIAGNOSIS — Z6841 Body Mass Index (BMI) 40.0 and over, adult: Secondary | ICD-10-CM | POA: Diagnosis not present

## 2023-03-03 DIAGNOSIS — N8502 Endometrial intraepithelial neoplasia [EIN]: Secondary | ICD-10-CM | POA: Diagnosis not present

## 2023-03-03 DIAGNOSIS — Z87891 Personal history of nicotine dependence: Secondary | ICD-10-CM | POA: Diagnosis not present

## 2023-03-03 DIAGNOSIS — Z01812 Encounter for preprocedural laboratory examination: Secondary | ICD-10-CM | POA: Insufficient documentation

## 2023-03-03 HISTORY — DX: Sleep apnea, unspecified: G47.30

## 2023-03-03 LAB — CBC
HCT: 40 % (ref 36.0–46.0)
Hemoglobin: 11.3 g/dL — ABNORMAL LOW (ref 12.0–15.0)
MCH: 18.5 pg — ABNORMAL LOW (ref 26.0–34.0)
MCHC: 28.3 g/dL — ABNORMAL LOW (ref 30.0–36.0)
MCV: 65.5 fL — ABNORMAL LOW (ref 80.0–100.0)
Platelets: 371 10*3/uL (ref 150–400)
RBC: 6.11 MIL/uL — ABNORMAL HIGH (ref 3.87–5.11)
RDW: 21.2 % — ABNORMAL HIGH (ref 11.5–15.5)
WBC: 10.1 10*3/uL (ref 4.0–10.5)
nRBC: 0 % (ref 0.0–0.2)

## 2023-03-03 LAB — BASIC METABOLIC PANEL
Anion gap: 11 (ref 5–15)
BUN: 16 mg/dL (ref 6–20)
CO2: 23 mmol/L (ref 22–32)
Calcium: 9.3 mg/dL (ref 8.9–10.3)
Chloride: 105 mmol/L (ref 98–111)
Creatinine, Ser: 0.67 mg/dL (ref 0.44–1.00)
GFR, Estimated: >60 mL/min (ref >60)
Glucose, Bld: 139 mg/dL — ABNORMAL HIGH (ref 70–99)
Potassium: 4.3 mmol/L (ref 3.5–5.1)
Sodium: 139 mmol/L (ref 135–145)

## 2023-03-03 LAB — HEMOGLOBIN A1C
Hgb A1c MFr Bld: 7.1 % — ABNORMAL HIGH (ref 4.8–5.6)
Mean Plasma Glucose: 157.07 mg/dL

## 2023-03-03 LAB — GLUCOSE, CAPILLARY: Glucose-Capillary: 131 mg/dL — ABNORMAL HIGH (ref 70–99)

## 2023-03-03 NOTE — Telephone Encounter (Signed)
Telephone call to check on pre-operative status.  Patient compliant with pre-operative instructions.  Reinforced nothing to eat after midnight. Clear liquids until 0515. Patient to arrive at 0615.  No questions or concerns voiced.  Instructed to call for any needs.  ?

## 2023-03-03 NOTE — Telephone Encounter (Signed)
Attempted to reach Hannah Figueroa for her pre-op review call. Left voicemail requesting call back at [phone].

## 2023-03-03 NOTE — Patient Instructions (Addendum)
SURGICAL WAITING ROOM VISITATION   Patients having surgery or a procedure may have no more than 2 support people in the waiting area - these visitors may rotate.     Children under the age of 38 must have an adult with them who is not the patient.   Due to an increase in RSV and influenza rates and associated hospitalizations, children ages 57 and under may not visit patients in [facility] hospitals.   If the patient needs to stay at the hospital during part of their recovery, the visitor guidelines for inpatient rooms apply. Pre-op nurse will coordinate an appropriate time for 1 support person to accompany patient in pre-op.  This support person may not rotate.    Please refer to the [facility] website for the visitor guidelines for Inpatients (after your surgery is over and you are in a regular room).                    Your procedure is scheduled on: 03/04/23               Report to [facility] Main Entrance               Report to admitting at 6:15 AM               Call this number if you have problems the morning of surgery [phone]               Do not eat food :After Midnight.               After Midnight you may have the following liquids until 5:30 AM DAY OF SURGERY   Water Non-Citrus Juices (without pulp, NO RED-Apple, White grape, White cranberry) Black Coffee (NO MILK/CREAM OR CREAMERS, sugar ok)  Clear Tea (NO MILK/CREAM OR CREAMERS, sugar ok) regular and decaf                             Plain Jell-O (NO RED)                                           Fruit ices (not with fruit pulp, NO RED)                                     Popsicles (NO RED)                                                               Sports drinks like Gatorade (NO RED)                         Oral Hygiene is also important to reduce your risk of infection.                                    Remember - BRUSH YOUR TEETH THE MORNING OF SURGERY WITH YOUR REGULAR TOOTHPASTE    DENTURES WILL  BE REMOVED PRIOR TO SURGERY PLEASE DO NOT APPLY "Poly grip" OR ADHESIVES!!!               Do NOT smoke after Midnight               Take these medicines the morning of surgery: Amlodipine  DO NOT TAKE ANY ORAL DIABETIC MEDICATIONS DAY OF YOUR SURGERY- HOLD METFORMIN THE DAY OF SURGERY                You may not have any metal on your body including hair pins, jewelry, and body piercing              Do not wear make-up, lotions, powders, perfumes or deodorant   Do not wear nail polish including gel and S&S, artificial/acrylic nails, or any other type of covering on natural nails including finger and toenails. If you have artificial nails, gel coating, etc. that needs to be removed by a nail salon please have this removed prior to surgery or surgery may need to be canceled/ delayed if the surgeon/ anesthesia feels like they are unable to be safely monitored.    Do not shave  48 hours prior to surgery.                Do not bring valuables to the hospital. [facility] IS NOT             RESPONSIBLE   FOR VALUABLES.                           DO NOT BRING YOUR HOME MEDICATIONS TO THE HOSPITAL. PHARMACY WILL DISPENSE MEDICATIONS LISTED ON YOUR MEDICATION LIST TO YOU DURING YOUR ADMISSION IN THE HOSPITAL!                          Patients discharged on the day of surgery will not be allowed to drive home.  Someone NEEDS to stay with you for the first 24 hours after anesthesia.                          Please read over the following fact sheets you were given: IF YOU HAVE QUESTIONS ABOUT YOUR PRE-OP INSTRUCTIONS PLEASE CALL [phone]        [facility] - Preparing for Surgery Before surgery, you can play an important role.  Because skin is not sterile, your skin needs to be as free of germs as possible.  You can reduce the number of germs on your skin by washing with CHG (chlorahexidine gluconate) soap before surgery.  CHG is an antiseptic cleaner which kills germs and bonds  with the skin to continue killing germs even after washing. Please DO NOT use if you have an allergy to CHG or antibacterial soaps.  If your skin becomes reddened/irritated stop using the CHG and inform your nurse when you arrive at Short Stay. Do not shave (including legs and underarms) for at least 48 hours prior to the first CHG shower.  You may shave your face/neck.   Please follow these instructions carefully:             1.  Shower with CHG Soap the night before surgery and the  morning of surgery.             2.  If you choose to wash your hair, wash your hair first  as usual with your normal  shampoo.             3.  After you shampoo, rinse your hair and body thoroughly to remove the shampoo.                                           4.  Use CHG as you would any other liquid soap.  You can apply chg directly to the skin and wash.  Gently with a scrungie or clean washcloth.             5.  Apply the CHG Soap to your body ONLY FROM THE NECK DOWN.   Do not use on face/ open                           Wound or open sores. Avoid contact with eyes, ears mouth and genitals (private parts).                       Wash face,  Genitals (private parts) with your normal soap.             6.  Wash thoroughly, paying special attention to the area where your  surgery  will be performed.             7.  Thoroughly rinse your body with warm water from the neck down.             8.  DO NOT shower/wash with your normal soap after using and rinsing off the CHG Soap.                9.  Pat yourself dry with a clean towel.            10.  Wear clean pajamas.            11.  Place clean sheets on your bed the night of your first shower and do not  sleep with pets. Day of Surgery : Do not apply any lotions/deodorants the morning of surgery.  Please wear clean clothes to the hospital/surgery center.   FAILURE TO FOLLOW THESE INSTRUCTIONS MAY RESULT IN THE CANCELLATION OF YOUR SURGERY    Patient Name  ______________________________________  Nurse Name __________________________________________________

## 2023-03-03 NOTE — Progress Notes (Signed)
COVID Vaccine received:  [x]  No []  Yes Date of any COVID positive Test in last 90 days:  None   PCP - Esperanza Richters PA-C Cardiologist -    Chest x-ray -  EKG -  11/03/22 Epic Stress Test -  ECHO -  Cardiac Cath -    Bowel Prep - [x]  No  []   Yes _____GYN Diet_   Pacemaker / ICD device [x]  No []  Yes   Spinal Cord Stimulator:[x]  No []  Yes       History of Sleep Apnea? []  No [x]  Yes   CPAP used?- [x]  No []  Yes  doesn't have a CPAP   Does the patient monitor blood sugar?          []  No [x]  Yes  []  N/A LAST A1c: 10.5 on 09-30-2022  Patient has: []  NO Hx DM   []  Pre-DM                 []  DM1  [x]   DM2 Does patient have a [employer] or Dexacom? [x]  No []  Yes   Fasting Blood Sugar Ranges- 100-130 Checks Blood Sugar __2-3_ times a day   METFORMIN-  500mg  bid is only med.  Hold DOS  Blood Thinner / Instructions: none Aspirin Instructions:  none   Activity level: Patient is able to climb a flight of stairs without difficulty; [x]  No CP but  would have SOB.  Patient can  perform ADLs without assistance.    Anesthesia review: asthma, GERD, OSA- no CPAP, anemia, DM2    Patient denies shortness of breath, fever, cough and chest pain at PAT appointment.   Patient verbalized understanding and agreement to the Pre-Surgical Instructions that were given to them at this PAT appointment. Patient was also educated of the need to review these PAT instructions again prior to his/her surgery.I reviewed the appropriate phone numbers to call if they have any and questions or concerns.

## 2023-03-04 ENCOUNTER — Ambulatory Visit (HOSPITAL_COMMUNITY): Payer: BC Managed Care – PPO | Admitting: Anesthesiology

## 2023-03-04 ENCOUNTER — Other Ambulatory Visit: Payer: Self-pay

## 2023-03-04 ENCOUNTER — Encounter (HOSPITAL_COMMUNITY)
Admission: RE | Disposition: A | Discharge status: Home / Self Care | Payer: Self-pay | Source: Home / Self Care | Attending: Gynecologic Oncology

## 2023-03-04 ENCOUNTER — Ambulatory Visit (HOSPITAL_COMMUNITY)
Admission: RE | Admit: 2023-03-04 | Discharge: 2023-03-04 | Disposition: A | Discharge status: Home / Self Care | Payer: BC Managed Care – PPO | Attending: Gynecologic Oncology | Admitting: Gynecologic Oncology

## 2023-03-04 ENCOUNTER — Encounter (HOSPITAL_COMMUNITY): Payer: Self-pay | Admitting: Gynecologic Oncology

## 2023-03-04 DIAGNOSIS — E669 Obesity, unspecified: Secondary | ICD-10-CM | POA: Insufficient documentation

## 2023-03-04 DIAGNOSIS — I10 Essential (primary) hypertension: Secondary | ICD-10-CM | POA: Diagnosis not present

## 2023-03-04 DIAGNOSIS — N8502 Endometrial intraepithelial neoplasia [EIN]: Secondary | ICD-10-CM | POA: Diagnosis not present

## 2023-03-04 DIAGNOSIS — N870 Mild cervical dysplasia: Secondary | ICD-10-CM | POA: Diagnosis not present

## 2023-03-04 DIAGNOSIS — Z87891 Personal history of nicotine dependence: Secondary | ICD-10-CM | POA: Diagnosis not present

## 2023-03-04 DIAGNOSIS — Z30430 Encounter for insertion of intrauterine contraceptive device: Secondary | ICD-10-CM

## 2023-03-04 DIAGNOSIS — Z01818 Encounter for other preprocedural examination: Secondary | ICD-10-CM

## 2023-03-04 DIAGNOSIS — E119 Type 2 diabetes mellitus without complications: Secondary | ICD-10-CM

## 2023-03-04 DIAGNOSIS — Z6841 Body Mass Index (BMI) 40.0 and over, adult: Secondary | ICD-10-CM | POA: Diagnosis not present

## 2023-03-04 DIAGNOSIS — G4733 Obstructive sleep apnea (adult) (pediatric): Secondary | ICD-10-CM | POA: Diagnosis not present

## 2023-03-04 HISTORY — PX: DILATATION & CURETTAGE/HYSTEROSCOPY WITH MYOSURE: SHX6511

## 2023-03-04 HISTORY — PX: INTRAUTERINE DEVICE (IUD) INSERTION: SHX5877

## 2023-03-04 LAB — GLUCOSE, CAPILLARY
Glucose-Capillary: 118 mg/dL — ABNORMAL HIGH (ref 70–99)
Glucose-Capillary: 99 mg/dL (ref 70–99)

## 2023-03-04 SURGERY — EXAM UNDER ANESTHESIA
Anesthesia: General

## 2023-03-04 MED ORDER — LIDOCAINE 2% (20 MG/ML) 5 ML SYRINGE
INTRAMUSCULAR | Status: DC | PRN
  Administered 2023-03-04: 100 mg via INTRAVENOUS

## 2023-03-04 MED ORDER — LACTATED RINGERS IV SOLN: INTRAVENOUS | Status: DC

## 2023-03-04 MED ORDER — KETOROLAC TROMETHAMINE 15 MG/ML IJ SOLN
15.0000 mg | Freq: Once | INTRAMUSCULAR | Status: AC
  Administered 2023-03-04: 15 mg via INTRAVENOUS

## 2023-03-04 MED ORDER — AMISULPRIDE (ANTIEMETIC) 5 MG/2ML IV SOLN: 10.0000 mg | Freq: Once | INTRAVENOUS | Status: DC | PRN

## 2023-03-04 MED ORDER — DEXAMETHASONE SODIUM PHOSPHATE 10 MG/ML IJ SOLN
INTRAMUSCULAR | Status: AC
  Filled 2023-03-04: qty 1

## 2023-03-04 MED ORDER — KETOROLAC TROMETHAMINE 30 MG/ML IJ SOLN: 15.0000 mg | Freq: Once | INTRAMUSCULAR | Status: DC

## 2023-03-04 MED ORDER — SODIUM CHLORIDE 0.9 % IR SOLN
Status: DC | PRN
  Administered 2023-03-04: 3000 mL

## 2023-03-04 MED ORDER — LIDOCAINE HCL (PF) 1 % IJ SOLN
INTRAMUSCULAR | Status: DC | PRN
  Administered 2023-03-04: 10 mL

## 2023-03-04 MED ORDER — HYDROMORPHONE HCL 1 MG/ML IJ SOLN
0.2500 mg | INTRAMUSCULAR | Status: DC | PRN
  Administered 2023-03-04: 0.5 mg via INTRAVENOUS

## 2023-03-04 MED ORDER — ORAL CARE MOUTH RINSE: 15.0000 mL | Freq: Once | OROMUCOSAL | Status: AC

## 2023-03-04 MED ORDER — LEVONORGESTREL 20 MCG/DAY IU IUD: 1.0000 | INTRAUTERINE_SYSTEM | Freq: Once | INTRAUTERINE | Status: DC

## 2023-03-04 MED ORDER — ONDANSETRON HCL 4 MG/2ML IJ SOLN
INTRAMUSCULAR | Status: DC | PRN
  Administered 2023-03-04: 4 mg via INTRAVENOUS

## 2023-03-04 MED ORDER — MEPERIDINE HCL 50 MG/ML IJ SOLN: 6.2500 mg | INTRAMUSCULAR | Status: DC | PRN

## 2023-03-04 MED ORDER — DEXAMETHASONE SODIUM PHOSPHATE 4 MG/ML IJ SOLN
4.0000 mg | INTRAMUSCULAR | Status: AC
  Administered 2023-03-04: 5 mg via INTRAVENOUS

## 2023-03-04 MED ORDER — HYDROMORPHONE HCL 1 MG/ML IJ SOLN
INTRAMUSCULAR | Status: AC
  Filled 2023-03-04: qty 1

## 2023-03-04 MED ORDER — LEVONORGESTREL 20 MCG/DAY IU IUD
1.0000 | INTRAUTERINE_SYSTEM | Freq: Once | INTRAUTERINE | Status: AC
  Administered 2023-03-04: 1 via INTRAUTERINE

## 2023-03-04 MED ORDER — ACETAMINOPHEN 500 MG PO TABS
1000.0000 mg | ORAL_TABLET | ORAL | Status: AC
  Administered 2023-03-04: 1000 mg via ORAL
  Filled 2023-03-04: qty 2

## 2023-03-04 MED ORDER — MIDAZOLAM HCL 2 MG/2ML IJ SOLN
INTRAMUSCULAR | Status: AC
  Filled 2023-03-04: qty 2

## 2023-03-04 MED ORDER — OXYCODONE HCL 5 MG/5ML PO SOLN: 5.0000 mg | Freq: Once | ORAL | Status: DC | PRN

## 2023-03-04 MED ORDER — SCOPOLAMINE 1 MG/3DAYS TD PT72
1.0000 | MEDICATED_PATCH | TRANSDERMAL | Status: DC
  Filled 2023-03-04: qty 1

## 2023-03-04 MED ORDER — FENTANYL CITRATE (PF) 100 MCG/2ML IJ SOLN
INTRAMUSCULAR | Status: AC
  Filled 2023-03-04: qty 2

## 2023-03-04 MED ORDER — LIDOCAINE HCL (PF) 1 % IJ SOLN
INTRAMUSCULAR | Status: AC
  Filled 2023-03-04: qty 30

## 2023-03-04 MED ORDER — MIDAZOLAM HCL 5 MG/5ML IJ SOLN
INTRAMUSCULAR | Status: DC | PRN
  Administered 2023-03-04: 2 mg via INTRAVENOUS

## 2023-03-04 MED ORDER — OXYCODONE HCL 5 MG PO TABS: 5.0000 mg | ORAL_TABLET | Freq: Once | ORAL | Status: DC | PRN

## 2023-03-04 MED ORDER — PROMETHAZINE HCL 25 MG/ML IJ SOLN: 6.2500 mg | INTRAMUSCULAR | Status: DC | PRN

## 2023-03-04 MED ORDER — FENTANYL CITRATE (PF) 100 MCG/2ML IJ SOLN
INTRAMUSCULAR | Status: DC | PRN
  Administered 2023-03-04: 100 ug via INTRAVENOUS

## 2023-03-04 MED ORDER — KETOROLAC TROMETHAMINE 15 MG/ML IJ SOLN
INTRAMUSCULAR | Status: AC
  Filled 2023-03-04: qty 1

## 2023-03-04 MED ORDER — INSULIN ASPART 100 UNIT/ML IJ SOLN: 0.0000 [IU] | INTRAMUSCULAR | Status: DC | PRN

## 2023-03-04 MED ORDER — PROPOFOL 10 MG/ML IV BOLUS
INTRAVENOUS | Status: AC
  Filled 2023-03-04: qty 20

## 2023-03-04 MED ORDER — PROPOFOL 10 MG/ML IV BOLUS
INTRAVENOUS | Status: DC | PRN
  Administered 2023-03-04: 170 mg via INTRAVENOUS

## 2023-03-04 MED ORDER — CHLORHEXIDINE GLUCONATE 0.12 % MT SOLN
15.0000 mL | Freq: Once | OROMUCOSAL | Status: AC
  Administered 2023-03-04: 15 mL via OROMUCOSAL

## 2023-03-04 SURGICAL SUPPLY — 30 items
BAG COUNTER SPONGE SURGICOUNT (BAG) ×1 IMPLANT
BAG SPNG CNTER NS LX DISP (BAG) ×1
BLADE SURG 15 STRL LF DISP TIS (BLADE) IMPLANT
BLADE SURG 15 STRL SS (BLADE)
BLADE SURG SZ11 CARB STEEL (BLADE) IMPLANT
CATH ROBINSON RED A/P 16FR (CATHETERS) IMPLANT
DEVICE MYOSURE LITE (MISCELLANEOUS) IMPLANT
DEVICE MYOSURE REACH (MISCELLANEOUS) IMPLANT
DILATOR CANAL MILEX (MISCELLANEOUS) IMPLANT
GAUZE 4X4 16PLY ~~LOC~~+RFID DBL (SPONGE) ×1 IMPLANT
GLOVE BIO SURGEON STRL SZ 6 (GLOVE) ×2 IMPLANT
GLOVE BIO SURGEON STRL SZ 6.5 (GLOVE) IMPLANT
GOWN STRL REUS W/ TWL LRG LVL3 (GOWN DISPOSABLE) ×1 IMPLANT
GOWN STRL REUS W/TWL LRG LVL3 (GOWN DISPOSABLE) ×1
IV NS IRRIG 3000ML ARTHROMATIC (IV SOLUTION) ×1 IMPLANT
KIT PROCEDURE FLUENT (KITS) IMPLANT
KIT TURNOVER KIT A (KITS) IMPLANT
LOOP CUTTING BIPOLAR 21FR (ELECTRODE) IMPLANT
MYOSURE XL FIBROID (MISCELLANEOUS)
Mirena IMPLANT
NDL HYPO 25X1 1.5 SAFETY (NEEDLE) IMPLANT
NEEDLE HYPO 25X1 1.5 SAFETY (NEEDLE)
PACK PERINEAL COLD (PAD) ×1 IMPLANT
PACK VAGINAL WOMENS (CUSTOM PROCEDURE TRAY) ×1 IMPLANT
PAD OB MATERNITY 4.3X12.25 (PERSONAL CARE ITEMS) IMPLANT
SEAL ROD LENS SCOPE MYOSURE (ABLATOR) IMPLANT
SYR BULB IRRIG 60ML STRL (SYRINGE) ×1 IMPLANT
SYSTEM TISS REMOVAL MYOSURE XL (MISCELLANEOUS) IMPLANT
TOWEL OR 17X26 10 PK STRL BLUE (TOWEL DISPOSABLE) ×2 IMPLANT
WATER STERILE IRR 500ML POUR (IV SOLUTION) ×2 IMPLANT

## 2023-03-04 NOTE — Interval H&P Note (Signed)
History and Physical Interval Note:  03/04/2023 6:42 AM  Hannah Figueroa  has presented today for surgery, with the diagnosis of Endometrial intraepithelial neoplasia.  The various methods of treatment have been discussed with the patient and family. After consideration of risks, benefits and other options for treatment, the patient has consented to  Procedure(s): EXAM UNDER ANESTHESIA (N/A) DILATATION & CURETTAGE/HYSTEROSCOPY WITH MYOSURE (N/A) POSSIBLE INTRAUTERINE DEVICE (IUD) INSERTION, MIRENA (N/A) as a surgical intervention.  The patient's history has been reviewed, patient examined, no change in status, stable for surgery.  I have reviewed the patient's chart and labs.  Questions were answered to the patient's satisfaction.     Carver Fila

## 2023-03-04 NOTE — Transfer of Care (Signed)
Immediate Anesthesia Transfer of Care Note  Patient: Hannah Figueroa  Procedure(s) Performed: Francia Greaves UNDER ANESTHESIA DILATATION & CURETTAGE/HYSTEROSCOPY WITH MYOSURE POSSIBLE INTRAUTERINE DEVICE (IUD) INSERTION, MIRENA  Patient Location: PACU  Anesthesia Type:General  Level of Consciousness: sedated  Airway & Oxygen Therapy: Patient Spontanous Breathing and Patient connected to face mask oxygen  Post-op Assessment: Report given to RN and Post -op Vital signs reviewed and stable  Post vital signs: Reviewed and stable  Last Vitals:  Vitals Value Taken Time  BP    Temp    Pulse    Resp    SpO2      Last Pain:  Vitals:   03/04/23 0718  TempSrc:   PainSc: 0-No pain      Patients Stated Pain Goal: 2 (03/04/23 0718)  Complications: No notable events documented.

## 2023-03-04 NOTE — Op Note (Signed)
OPERATIVE NOTE  PATIENT: Hannah Figueroa DATE: 03/04/23  Preop Diagnosis: EIN, obesity (BMI 53)  Postoperative Diagnosis: same as above  Surgery: Dilation, hysteroscopy with endometrial sampling using the Myosure, Miena IUD insertion (LOT #[id number], exp 03/2025)  Surgeons:  Eugene Garnet MD  Assistant: none  Anesthesia: General   Estimated blood loss: 25 ml  IVF:  see I&O flowsheet   Fluid deficit: 0 ml  Urine output: n/a   Complications: None apparent  Pathology: endometrial curetteings  Operative findings: Uterus mobile, anterverted, apical prolapse noted (cervix until above the level of the hymen). Uterus sounded to 10 cm. Cavity somewhat atrophic in appearance with several small polypoid structures. Minimal arcuate appearance of the uterine fundus. Ostia noted although some adhesions obscured openings of ostia bilaterally.   Procedure: The patient was identified in the preoperative holding area. Informed consent was signed on the chart. Patient was seen history was reviewed and exam was performed.   The patient was then taken to the operating room and placed in the supine position with SCD hose on. General anesthesia was then induced without difficulty. She was then placed in the dorsolithotomy position. The perineum was prepped with CHG. The vagina was prepped with CHG. The patient was then draped after the prep was dried.   Timeout was performed the patient, procedure, antibiotic, allergy, and length of procedure.   The speculum was placed in the posterior vagina. The single tooth tenaculum was placed on the anterior lip of the cervix. 10 cc of 1% lidocaine was injected as a paracervical block. The uterine sound was placed in the cervix and advanced to the fundus. The cervix was already dilated and did not require further dilation. The Myosure hysteroscope was inserted through the endocervix and into the uterine cavity. Pictures were taken. Endometrial sampling  was then performed with the Myosure Reach. The specimen was collected in the specimen sock to be sent to pathology.  The hysteroscope was removed from the uterus. The Mirena IUD was inserted to the fundus, deployed, and the inserter removed. Strings were cut at 3-4 cm. The hysteroscope was again advanced into the endometrial cavity to assure fundal IUD placement.  The hysteroscope was removed.  The tenaculum was removed and hemostasis was observed.   The vagina was irrigated.  All instrument, suture, laparotomy, Ray-Tec, and needle counts were correct x2. The patient tolerated the procedure well and was taken recovery room in stable condition.   Carver Fila, MD

## 2023-03-04 NOTE — Anesthesia Postprocedure Evaluation (Signed)
Anesthesia Post Note  Patient: Hannah Figueroa  Procedure(s) Performed: EXAM UNDER ANESTHESIA DILATATION & CURETTAGE/HYSTEROSCOPY WITH MYOSURE INTRAUTERINE DEVICE (IUD) INSERTION, MIRENA     Patient location during evaluation: PACU Anesthesia Type: General Level of consciousness: awake and alert Pain management: pain level controlled Vital Signs Assessment: post-procedure vital signs reviewed and stable Respiratory status: spontaneous breathing, nonlabored ventilation and respiratory function stable Cardiovascular status: blood pressure returned to baseline and stable Postop Assessment: no apparent nausea or vomiting Anesthetic complications: no   No notable events documented.  Last Vitals:  Vitals:   03/04/23 0930 03/04/23 1000  BP: (!) 150/71 (!) 155/60  Pulse: 72 68  Resp: 13 (!) 2  Temp:    SpO2: 97% 98%    Last Pain:  Vitals:   03/04/23 0926  TempSrc:   PainSc: 9                  Lowella Curb

## 2023-03-04 NOTE — Anesthesia Procedure Notes (Signed)
Procedure Name: LMA Insertion Date/Time: 03/04/2023 8:28 AM  Performed by: Doran Clay, CRNAPre-anesthesia Checklist: Patient identified, Emergency Drugs available, Suction available, Patient being monitored and Timeout performed Patient Re-evaluated:Patient Re-evaluated prior to induction Oxygen Delivery Method: Circle system utilized Preoxygenation: Pre-oxygenation with 100% oxygen Induction Type: IV induction LMA: LMA inserted LMA Size: 4.0 Tube type: Oral Number of attempts: 1 Placement Confirmation: positive ETCO2 and breath sounds checked- equal and bilateral Tube secured with: Tape Dental Injury: Teeth and Oropharynx as per pre-operative assessment

## 2023-03-04 NOTE — OR Nursing (Signed)
Hysteroscopy deficit 0 mL

## 2023-03-04 NOTE — Anesthesia Preprocedure Evaluation (Signed)
Anesthesia Evaluation  Patient identified by MRN, date of birth, ID band Patient awake    Reviewed: Allergy & Precautions, NPO status , Patient's Chart, lab work & pertinent test results  Airway Mallampati: II  TM Distance: >3 FB Neck ROM: Full    Dental no notable dental hx. (+) Teeth Intact, Dental Advisory Given   Pulmonary asthma , sleep apnea and Continuous Positive Airway Pressure Ventilation , former smoker   Pulmonary exam normal breath sounds clear to auscultation       Cardiovascular hypertension, Pt. on medications Normal cardiovascular exam Rhythm:Regular Rate:Normal     Neuro/Psych    GI/Hepatic   Endo/Other  diabetes, Well Controlled, Type 2  Morbid obesity (BMI 57.41)  Renal/GU Lab Results      Component                Value               Date                      CREATININE               0.71                11/18/2022                BUN                      13                  11/18/2022                NA                       135                 11/18/2022                K                        4.1                 11/18/2022                CL                       101                 11/18/2022                CO2                      26                  11/18/2022                Musculoskeletal  (+) Arthritis , Osteoarthritis,    Abdominal  (+) + obese  Peds  Hematology  (+) Blood dyscrasia, anemia Lab Results      Component                Value               Date                      WBC  12.1 (H)            11/18/2022                HGB                      8.5 (L)             11/18/2022                HCT                      32.9 (L)            11/18/2022                MCV                      61.6 (L)            11/18/2022                PLT                      462 (H)             11/18/2022              Anesthesia Other Findings All: meloxicam and PCN   Reproductive/Obstetrics                             Anesthesia Physical Anesthesia Plan  ASA: 3  Anesthesia Plan: General   Post-op Pain Management: Tylenol PO (pre-op)*   Induction: Intravenous  PONV Risk Score and Plan: 3 and Midazolam, Treatment may vary due to age or medical condition, Ondansetron and Dexamethasone  Airway Management Planned: LMA  Additional Equipment: None  Intra-op Plan:   Post-operative Plan: Extubation in OR  Informed Consent: I have reviewed the patients History and Physical, chart, labs and discussed the procedure including the risks, benefits and alternatives for the proposed anesthesia with the patient or authorized representative who has indicated his/her understanding and acceptance.     Dental advisory given  Plan Discussed with: CRNA, Anesthesiologist and Surgeon  Anesthesia Plan Comments: (PAT note written 12/09/2022 by Shonna Chock, PA-C.   )       Anesthesia Quick Evaluation

## 2023-03-04 NOTE — Discharge Instructions (Signed)
AFTER SURGERY INSTRUCTIONS   Return to work:  1-2 days if applicable   Activity: 1. Be up and out of the bed during the day.  Take a nap if needed.  You may walk up steps but be careful and use the hand rail.  Stair climbing will tire you more than you think, you may need to stop part way and rest.    2. No lifting or straining for 2 weeks over 10 pounds. No pushing, pulling, straining for 2 weeks.   3. No driving for minimum 24 hours after surgery.  Do not drive if you are taking narcotic pain medicine and make sure that your reaction time has returned.    4. You can shower as soon as the next day after surgery. Shower daily. No tub baths or submerging your body in water until cleared by your surgeon. If you have the soap that was given to you by pre-surgical testing that was used before surgery, you do not need to use it afterwards because this can irritate your incisions.    5. No sexual activity and nothing in the vagina for 2 weeks minimum.   6. You may experience vaginal spotting and discharge after surgery.  The spotting is normal but if you experience heavy bleeding, call our office.   7. Take Tylenol or ibuprofen first for pain if you are able to take these medications. Monitor your Tylenol intake to a max of 4,000 mg in a 24 hour period. You can alternate these medications after surgery.   Diet: 1. Low sodium Heart Healthy Diet is recommended but you are cleared to resume your normal (before surgery) diet after your procedure.   2. It is safe to use a laxative, such as Miralax or Colace, if you have difficulty moving your bowels.    Wound Care: 1. Keep clean and dry.  Shower daily.   Reasons to call the Doctor: Fever - Oral temperature greater than 100.4 degrees Fahrenheit Foul-smelling vaginal discharge Difficulty urinating Nausea and vomiting Increased pain at the site of the incision that is unrelieved with pain medicine. Difficulty breathing with or without chest  pain New calf pain especially if only on one side Sudden, continuing increased vaginal bleeding with or without clots.   Contacts: For questions or concerns you should contact:   Dr. Eugene Garnet at [phone]   Warner Mccreedy, NP at [phone]   After Hours: call [phone] and have the GYN Oncologist paged/contacted (after 5 pm or on the weekends).   Messages sent via mychart are for non-urgent matters and are not responded to after hours so for urgent needs, please call the after hours number.

## 2023-03-05 ENCOUNTER — Telehealth: Payer: Self-pay | Admitting: *Deleted

## 2023-03-05 ENCOUNTER — Encounter (HOSPITAL_COMMUNITY): Payer: Self-pay | Admitting: Gynecologic Oncology

## 2023-03-05 LAB — SURGICAL PATHOLOGY

## 2023-03-05 NOTE — Telephone Encounter (Signed)
Attempted to reach Ms. Roxan Hockey for post op call. LVM requesting call back to [phone].

## 2023-03-05 NOTE — Telephone Encounter (Signed)
Spoke with Ms. Hannah Figueroa who called the office and asked what the next steps would be since her biopsy results came back today and Hannah Figueroa read them on MyChart and was happy for the great news. Hannah Figueroa advised that she would follow up with Dr. Pricilla Holm on October 11 th at 3:15 and if Hannah Figueroa had any other concerns or questions don't hesitate to call the office at [phone].

## 2023-03-05 NOTE — Telephone Encounter (Signed)
Spoke with Ms. Hannah Figueroa who returned call from office. She states she is eating, drinking and urinating well. She has not had a BM yet but is passing gas. She is not taking senokot but encouraged to if she needs it.  Also encouraged her to drink plenty of water. She denies fever or chills. She is only have minimal spotting and cramping that is not consistent. She rates her cramping  3/10. She is not taking anything for her cramping.   Instructed to call office with any fever, chills, purulent drainage, uncontrolled pain or any other questions or concerns. Patient verbalizes understanding.   Pt aware of post op appointments as well as the office number [phone] and after hours number [phone] to call if she has any questions or concerns

## 2023-03-10 ENCOUNTER — Telehealth: Payer: Self-pay

## 2023-03-10 NOTE — Telephone Encounter (Signed)
Ms.Thum called with several small concerns. She is S/P EXAM UNDER ANESTHESIA  DILATATION & CURETTAGE/HYSTEROSCOPY WITH MYOSURE  INTRAUTERINE DEVICE (IUD) INSERTION, MIRENA on 03/04/23 with Dr. Pricilla Holm.   The day after surgery she had a "mucous plug" come out. Periodically she will have cramping, 3-5 on pain scale, sm amt of bleeding, headaches and nausea. All of this is sporadic and not strong enough to take pain medication. She is eating/drinking, having BM's and passing gas. No fever/chills. Her B/P has been slightly elevated, states she is not really compliant with taking her med.   Pt is aware S/S are probably coming from having the IUD placed and will subside once the body gets used to the hormones from the IUD. She is aware Dr.Tucker is out of the office today and will return tomorrow. I will call her with advice.

## 2023-03-10 NOTE — Telephone Encounter (Signed)
Pt is aware of message below and was thankful for the call

## 2023-03-17 ENCOUNTER — Telehealth: Payer: Self-pay | Admitting: *Deleted

## 2023-03-17 ENCOUNTER — Inpatient Hospital Stay: Payer: BC Managed Care – PPO

## 2023-03-17 ENCOUNTER — Inpatient Hospital Stay: Payer: BC Managed Care – PPO | Admitting: Genetic Counselor

## 2023-03-17 NOTE — Telephone Encounter (Signed)
We can either monitor this closely or restart her on some low dose oral progesterone (like she was taking before). It can be helpful to continue this for a couple of months in some patients after IUD placement

## 2023-03-17 NOTE — Telephone Encounter (Signed)
Spoke with Ms. Hannah Figueroa and relayed message from Dr. Pricilla Holm in regards to her vaginal bleeding. Pt states the bleeding has decreased some and she would like to monitor and hold off on starting back on low dose progesterone for now. Pt will call the office with worsening bleeding and if not worsening she will call the office back next Monday with update and talk about the oral progesterone. No further concerns or questions at this time.

## 2023-03-17 NOTE — Telephone Encounter (Signed)
Spoke with Ms. Salkowski who called the office stating she has vaginal bleeding, a period that started on Sunday. The blood is bright red, no clots, heavy and saturating pads and changing peri pad every 3 hours. Pt denies cramps or pain, no fever or chills but has a headache that is mild that comes and goes.   Pt denies anything per vagina, has not done any heavy lifting or exercise that precipitated the bleeding. Patient states she has not had a period only spotting since her D & C and insertion of IUD on July 17th.   Pt states, "I feel good other than the full blown menstrual cycle"  Pt advised her message would be relayed to providers.

## 2023-03-30 ENCOUNTER — Telehealth: Payer: Self-pay | Admitting: *Deleted

## 2023-03-30 NOTE — Telephone Encounter (Signed)
Spoke with Hannah Figueroa who called the office stating her vaginal bleeding stopped last Monday, August 5 th and started back up again Thursday, August 8th. Pt states the bleeding is red, denies clots, and is changing a pad 3-5 times a day. States the bleeding is moderate to heavy. Pt is asking how long should the bleeding last with the IUD.   Pt denies fever, chills, fatigue, and lightheadedness. Pt states she no longer has the headaches and denies nausea and vomiting. Pt denies any activity changes that promoted the bleeding. Advised patient her message would be relayed to providers and the office would call back with any new recommendations.

## 2023-03-30 NOTE — Telephone Encounter (Signed)
Spoke with Ms. Hannah Figueroa and relayed message from Dr. Pricilla Holm. That you can still have some bleeding with the IUD. Options are to monitor bleeding for another few days or can restart Provera. Per pt. She is not currently taking Provera, but will monitor the bleeding and will consider starting the provera.  Pt also asked if she could use tampons, and advised patient per Dr. Pricilla Holm that it's ok to use tampon. Pt verbalized understanding and will monitor and follow up with the office.

## 2023-03-30 NOTE — Telephone Encounter (Signed)
Is she still taking the Provera? Patient can still have some bleeding with the IUD. Options are to monitor bleeding for another few days or can restart Provera if she has stopped it.

## 2023-05-26 ENCOUNTER — Other Ambulatory Visit: Payer: Self-pay | Admitting: Gynecologic Oncology

## 2023-05-26 DIAGNOSIS — R102 Pelvic and perineal pain: Secondary | ICD-10-CM

## 2023-05-26 MED ORDER — OXYCODONE HCL 5 MG PO TABS
5.0000 mg | ORAL_TABLET | Freq: Once | ORAL | 0 refills | Status: AC
Start: 2023-05-26 — End: 2023-05-26

## 2023-05-27 ENCOUNTER — Telehealth: Payer: Self-pay | Admitting: *Deleted

## 2023-05-27 NOTE — Telephone Encounter (Signed)
Spoke with Ms. Hannah Figueroa and relayed message from Warner Mccreedy, NP since patient's last experience with biopsy in the office was uncomfortable and she vasovagaled a Rx for a pain pill will be sent in for pt to take 30 minutes prior to exam. Pt advised she will need someone to drive her to and from her appointment. Pt verbalized understanding and thanked the office for calling and said, "I am ready for my appointment on Friday."

## 2023-05-27 NOTE — Telephone Encounter (Signed)
-----   Message from Hannah Figueroa sent at 05/26/2023  9:30 AM EDT ----- Looks like the procedure was uncomfortable in the office and she vasovagaled. I can send in a pain pill that she would take 30 min before exam and needs a driver to and from

## 2023-05-29 ENCOUNTER — Encounter: Payer: Self-pay | Admitting: Gynecologic Oncology

## 2023-05-29 ENCOUNTER — Inpatient Hospital Stay: Payer: BC Managed Care – PPO | Attending: Hematology & Oncology | Admitting: Gynecologic Oncology

## 2023-05-29 VITALS — BP 180/68 | HR 82 | Temp 98.3°F | Resp 18 | Wt 311.6 lb

## 2023-05-29 DIAGNOSIS — Z79890 Hormone replacement therapy (postmenopausal): Secondary | ICD-10-CM | POA: Diagnosis not present

## 2023-05-29 DIAGNOSIS — N907 Vulvar cyst: Secondary | ICD-10-CM | POA: Diagnosis not present

## 2023-05-29 DIAGNOSIS — N8500 Endometrial hyperplasia, unspecified: Secondary | ICD-10-CM | POA: Diagnosis not present

## 2023-05-29 DIAGNOSIS — N8502 Endometrial intraepithelial neoplasia [EIN]: Secondary | ICD-10-CM | POA: Diagnosis not present

## 2023-05-29 DIAGNOSIS — Z6841 Body Mass Index (BMI) 40.0 and over, adult: Secondary | ICD-10-CM | POA: Insufficient documentation

## 2023-05-29 NOTE — Progress Notes (Signed)
Gynecologic Oncology Return Clinic Visit  05/29/23  Reason for Visit: surveillance  Treatment History: The patient was initially seen in October 2020 with daily postmenopausal bleeding for 3 years.  Pathology at that time showed both an endometrial polyp as well as an endocervical polyp without hyperplasia or malignancy.  Patient then represented in February of this year with continued daily bleeding although less heavy than it had been.   She was prescribed Megace at a couple of points previously but lost the prescription each time.   10/08/22: Endometrial biopsy performed in the office showed benign endometrial polyp with hyperplasia without atypia.  No malignancy. 10/08/2022: Pelvic ultrasound exam shows a uterus measuring 10.7 x 6.1 x 7.2 with a heterogeneous myometrium.  Hypoechoic soft tissue mass seen in the anterior right uterus measuring up to 5 cm, potentially an exophytic fibroid although incompletely characterized.  Endometrium measures 13 mm.  Neither ovary definitively seen. 12/08/2022: Patient underwent hysteroscopy with polypectomy, D&C.  Plan had been for endometrial ablation with the NovaSure.  Device would not fully open the cavity so endometrial ablation was abandoned. Endometrial curettage showed benign inactive endometrium, scant fragments of benign endometrial polyp, no hyperplasia or malignancy.  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  There are rare foci concerning for well-differentiated endometrial adenocarcinoma.   Her PMH is notable for DM2. Last hemoglocin A1c was 10.5%.  She notes that since starting her diet, her blood glucose has ranged between 118 and 130.  She is currently on metformin managed by her primary care provider.   Decision made to proceed with Mirena IUD placement for treatment while working on DM2 control, weight loss. Given delay in obtaining IUD, the patient was started on provera 10 mg every day on 01/28/23.  03/04/23:  Hysteroscopy with endometrial sampling using the MyoSure, Mirena IUD insertion.  Simple and complex hyperplasia with atypia involving a polyp.  Interval History: Doing well.  Had heavier bleeding for about 2 weeks after the IUD was placed.  Now having intermittent spotting, not daily.  Denies any cramping.  Reports baseline bowel bladder function.    Past Medical/Surgical History: Past Medical History:  Diagnosis Date   Anemia    Asthma 11/15/2015   hospitalized as a child, uses daily steroid, rare rescue inhaler use   Diabetes mellitus without complication (HCC)    Esophageal reflux 11/15/2015   Hypertension    Pneumonia    Sleep apnea     Past Surgical History:  Procedure Laterality Date   CESAREAN SECTION     x2   CHOLECYSTECTOMY     DILATATION & CURETTAGE/HYSTEROSCOPY WITH MYOSURE  12/10/2022   Procedure: DILATATION & CURETTAGE, HYSTEROSCOPY, POLYPECTOMY  WITH MYOSURE;  Surgeon: Willodean Rosenthal, MD;  Location: [facility];  Service: Gynecology;;   DILATATION & CURETTAGE/HYSTEROSCOPY WITH MYOSURE N/A 03/04/2023   Procedure: DILATATION & CURETTAGE/HYSTEROSCOPY WITH MYOSURE;  Surgeon: Carver Fila, MD;  Location: [facility];  Service: Gynecology;  Laterality: N/A;   HYSTEROSCOPY WITH NOVASURE  12/10/2022   Procedure: HYSTEROSCOPY WITH ATTEMPTED NOVASURE;  Surgeon: Willodean Rosenthal, MD;  Location: [facility];  Service: Gynecology;;  DEVICE UNABLE TO REACH UTERINE CAVITY   INTRAUTERINE DEVICE (IUD) INSERTION N/A 03/04/2023   Procedure: INTRAUTERINE DEVICE (IUD) INSERTION, MIRENA;  Surgeon: Carver Fila, MD;  Location: [facility];  Service: Gynecology;  Laterality: N/A;   TONSILLECTOMY     TUBAL LIGATION      Family History  Problem Relation Age of Onset   Hypertension Mother  Diabetes Mother    Asthma Father    Diabetes Father    Heart attack Father    Ovarian cancer Sister        30 years ago, died at age 48   Colon cancer Neg Hx    Breast cancer Neg Hx     Endometrial cancer Neg Hx    Pancreatic cancer Neg Hx    Prostate cancer Neg Hx     Social History   Socioeconomic History   Marital status: Married    Spouse name: Not on file   Number of children: Not on file   Years of education: Not on file   Highest education level: Not on file  Occupational History   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  Tobacco Use   Smoking status: Former    Current packs/day: 0.00    Types: Cigarettes    Start date: 1990    Quit date: 1995    Years since quitting: 29.7   Smokeless tobacco: Never   Tobacco comments:    Quit in early 30's  Vaping Use   Vaping status: Never Used  Substance and Sexual Activity   Alcohol use: No    Comment: soc   Drug use: No   Sexual activity: Yes    Birth control/protection: Surgical  Other Topics Concern   Not on file  Social History Narrative   Not on file   Social Determinants of Health   Financial Resource Strain: Not on file  Food Insecurity: No Food Insecurity (11/18/2022)   Hunger Vital Sign    Worried About Running Out of Food in the Last Year: Never true    Ran Out of Food in the Last Year: Never true  Transportation Needs: No Transportation Needs (11/18/2022)   PRAPARE - Administrator, Civil Service (Medical): No    Lack of Transportation (Non-Medical): No  Physical Activity: Not on file  Stress: Not on file  Social Connections: Not on file    Current Medications:  Current Outpatient Medications:    glucose blood (ACCU-CHEK AVIVA PLUS) test strip, Check blood sugar BID  one time early morning fasting and second time after lunch or dinner., Disp: 100 each, Rfl: 12   Iron, Ferrous Sulfate, 325 (65 Fe) MG TABS, Take 325 mg by mouth daily., Disp: 30 tablet, Rfl: 1   Lancets (ACCU-CHEK MULTICLIX) lancets, Check blood sugar BID  one time early morning fasting and second time after lunch or dinner., Disp: 100 each, Rfl: 12   melatonin 3 MG TABS tablet, Take 6 mg by mouth at  bedtime as needed (sleep)., Disp: , Rfl:    amLODipine (NORVASC) 10 MG tablet, Take 1 tablet (10 mg total) by mouth daily. (Patient not taking: Reported on 05/25/2023), Disp: 30 tablet, Rfl: 11   atorvastatin (LIPITOR) 10 MG tablet, Take 1 tablet (10 mg total) by mouth daily. (Patient not taking: Reported on 02/24/2023), Disp: 90 tablet, Rfl: 3   levonorgestrel (MIRENA) 20 MCG/DAY IUD, 1 each by Intrauterine route once for 1 dose. (Patient not taking: Reported on 03/03/2023), Disp: 1 each, Rfl: 0   losartan (COZAAR) 100 MG tablet, Take 1 tablet (100 mg total) by mouth daily. (Patient not taking: Reported on 03/03/2023), Disp: 90 tablet, Rfl: 3   medroxyPROGESTERone (PROVERA) 10 MG tablet, Take 1 tablet (10 mg total) by mouth daily. (Patient not taking: Reported on 03/03/2023), Disp: 30 tablet, Rfl: 1   metFORMIN (GLUCOPHAGE) 500 MG tablet, Take 1 tablet (500 mg  total) by mouth 2 (two) times daily with a meal. (Patient not taking: Reported on 05/25/2023), Disp: 60 tablet, Rfl: 3  Review of Systems: Denies appetite changes, fevers, chills, fatigue, unexplained weight changes. Denies hearing loss, neck lumps or masses, mouth sores, ringing in ears or voice changes. Denies cough or wheezing.  Denies shortness of breath. Denies chest pain or palpitations. Denies leg swelling. Denies abdominal distention, pain, blood in stools, constipation, diarrhea, nausea, vomiting, or early satiety. Denies pain with intercourse, dysuria, frequency, hematuria or incontinence. Denies hot flashes, pelvic pain, vaginal bleeding or vaginal discharge.   Denies joint pain, back pain or muscle pain/cramps. Denies itching, rash, or wounds. Denies dizziness, headaches, numbness or seizures. Denies swollen lymph nodes or glands, denies easy bruising or bleeding. Denies anxiety, depression, confusion, or decreased concentration.  Physical Exam: BP (!) 180/68 (BP Location: Left Wrist, Patient Position: Sitting) Comment: 2nd Bp  180/68...  Pulse 82   Temp 98.3 F (36.8 C) (Oral)   Resp 18   Wt (!) 311 lb 9.6 oz (141.3 kg)   SpO2 100%   BMI 51.85 kg/m  General: Alert, oriented, no acute distress. HEENT: Normocephalic, atraumatic, sclera anicteric. Chest: Unlabored breathing on room air. Abdomen: Obese, soft, nontender.  Normoactive bowel sounds.  No masses or hepatosplenomegaly appreciated.   Extremities: Grossly normal range of motion.  Warm, well perfused.  No edema bilaterally.  GU: Normal appearing external genitalia without erythema, excoriation, or lesions.  Speculum exam reveals no blood or discharge within the vagina.  IUD string seen protruding approximately 4 cm from the cervix.  Endometrial biopsy procedure Preoperative diagnosis: EIN Postoperative diagnosis: Same as above Physician: Pricilla Holm MD Estimated blood loss: Minimal Specimens: Endometrial biopsy Procedure: After the procedure was discussed with the patient including risks and benefits, she gave verbal consent.  She was then placed in dorsolithotomy position and a speculum was placed in the vagina.  Once the cervix was well visualized it was cleansed with Betadine x3.  An endometrial Pipelle was then passed to a depth of just over 8 cm.  1 pass was performed with sufficient tissue obtained.  This was placed in formalin.  Overall the patient tolerated the procedure well.  All instruments were removed from the vagina.  Laboratory & Radiologic Studies: A. ENDOMETRIUM, CURETTAGE:  Simple and complex hyperplasia with morular metaplasia and focal atypia (EIN) involving a polyp   Assessment & Plan: Hannah Figueroa is a 62 y.o. 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  Patient is overall doing well without significant symptoms.  Discussed some of her vulvar symptoms which look like some sebaceous cysts.  I think this may be related to change in products recently.  I do not see any erythema or signs of  infection.  Patient tolerated her biopsy quite well with premedication today.  She is interested in using premedication as well as some injectable lidocaine for her next biopsy.  Will call her with biopsy results.  We discussed again today that the goal is to continue to work towards weight loss so that definitive surgery is safer.  She was congratulated on her continued weight loss to date.  20 minutes of total time was spent for this patient encounter, including preparation, face-to-face counseling with the patient and coordination of care, and documentation of the encounter.  Eugene Garnet, MD  Division of Gynecologic Oncology  Department of Obstetrics and Gynecology  [facility]

## 2023-05-29 NOTE — Patient Instructions (Signed)
It was good to see you today.  I will let you know when I get your biopsy back.  I will plan to see you in 3 months.  Please let me know if anything changes before I see you next.

## 2023-06-02 LAB — SURGICAL PATHOLOGY

## 2023-08-28 ENCOUNTER — Telehealth: Payer: Self-pay | Admitting: *Deleted

## 2023-08-28 ENCOUNTER — Inpatient Hospital Stay: Payer: BC Managed Care – PPO | Admitting: Gynecologic Oncology

## 2023-08-28 ENCOUNTER — Other Ambulatory Visit: Payer: Self-pay | Admitting: Gynecologic Oncology

## 2023-08-28 DIAGNOSIS — N8502 Endometrial intraepithelial neoplasia [EIN]: Secondary | ICD-10-CM

## 2023-08-28 DIAGNOSIS — R55 Syncope and collapse: Secondary | ICD-10-CM

## 2023-08-28 DIAGNOSIS — R102 Pelvic and perineal pain: Secondary | ICD-10-CM

## 2023-08-28 MED ORDER — OXYCODONE HCL 5 MG PO TABS
5.0000 mg | ORAL_TABLET | Freq: Once | ORAL | 0 refills | Status: AC
Start: 2023-09-04 — End: 2023-09-04

## 2023-08-28 NOTE — Progress Notes (Signed)
 See RN note. One time dose of oxycodone sent in for patient prior to her appt with Dr. Pricilla Holm and a pelvic exam, biopsy.

## 2023-08-28 NOTE — Progress Notes (Unsigned)
 Gynecologic Oncology Return Clinic Visit  08/28/23  Reason for Visit: surveillance   Treatment History: The patient was initially seen in October 2020 with daily postmenopausal bleeding for 3 years.  Pathology at that time showed both an endometrial polyp as well as an endocervical polyp without hyperplasia or malignancy.  Patient then represented in February of this year with continued daily bleeding although less heavy than it had been.   She was prescribed Megace  at a couple of points previously but lost the prescription each time.   10/08/22: Endometrial biopsy performed in the office showed benign endometrial polyp with hyperplasia without atypia.  No malignancy. 10/08/2022: Pelvic ultrasound exam shows a uterus measuring 10.7 x 6.1 x 7.2 with a heterogeneous myometrium.  Hypoechoic soft tissue mass seen in the anterior right uterus measuring up to 5 cm, potentially an exophytic fibroid although incompletely characterized.  Endometrium measures 13 mm.  Neither ovary definitively seen. 12/08/2022: Patient underwent hysteroscopy with polypectomy, D&C.  Plan had been for endometrial ablation with the NovaSure.  Device would not fully open the cavity so endometrial ablation was abandoned. Endometrial curettage showed benign inactive endometrium, scant fragments of benign endometrial polyp, no hyperplasia or malignancy.  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  There are rare foci concerning for well-differentiated endometrial adenocarcinoma.   Her PMH is notable for DM2. Last hemoglocin A1c was 10.5%.  She notes that since starting her diet, her blood glucose has ranged between 118 and 130.  She is currently on metformin  managed by her primary care provider.   Decision made to proceed with Mirena  IUD placement for treatment while working on DM2 control, weight loss. Given delay in obtaining IUD, the patient was started on provera  10 mg every day on 01/28/23.    03/04/23: Hysteroscopy with endometrial sampling using the MyoSure, Mirena  IUD insertion.  Simple and complex hyperplasia with atypia involving a polyp.   05/29/23: Endometrial biopsy shows inactive endometrium with stromal progestational changes, no EIN or malignancy.  Interval History: ***  Past Medical/Surgical History: Past Medical History:  Diagnosis Date   Anemia    Asthma 11/15/2015   hospitalized as a child, uses daily steroid, rare rescue inhaler use   Diabetes mellitus without complication (HCC)    Esophageal reflux 11/15/2015   Hypertension    Pneumonia    Sleep apnea     Past Surgical History:  Procedure Laterality Date   CESAREAN SECTION     x2   CHOLECYSTECTOMY     DILATATION & CURETTAGE/HYSTEROSCOPY WITH MYOSURE  12/10/2022   Procedure: DILATATION & CURETTAGE, HYSTEROSCOPY, POLYPECTOMY  WITH MYOSURE;  Surgeon: Corene Coy, MD;  Location: [facility];  Service: Gynecology;;   DILATATION & CURETTAGE/HYSTEROSCOPY WITH MYOSURE N/A 03/04/2023   Procedure: DILATATION & CURETTAGE/HYSTEROSCOPY WITH MYOSURE;  Surgeon: Viktoria Comer SAUNDERS, MD;  Location: [facility];  Service: Gynecology;  Laterality: N/A;   HYSTEROSCOPY WITH NOVASURE  12/10/2022   Procedure: HYSTEROSCOPY WITH ATTEMPTED NOVASURE;  Surgeon: Corene Coy, MD;  Location: [facility];  Service: Gynecology;;  DEVICE UNABLE TO REACH UTERINE CAVITY   INTRAUTERINE DEVICE (IUD) INSERTION N/A 03/04/2023   Procedure: INTRAUTERINE DEVICE (IUD) INSERTION, MIRENA ;  Surgeon: Viktoria Comer SAUNDERS, MD;  Location: [facility];  Service: Gynecology;  Laterality: N/A;   TONSILLECTOMY     TUBAL LIGATION      Family History  Problem Relation Age of Onset   Hypertension Mother    Diabetes Mother    Asthma Father    Diabetes Father  Heart attack Father    Ovarian cancer Sister        30 years ago, died at age 58   Colon cancer Neg Hx    Breast cancer Neg Hx    Endometrial cancer Neg Hx    Pancreatic cancer Neg Hx     Prostate cancer Neg Hx     Social History   Socioeconomic History   Marital status: Married    Spouse name: Not on file   Number of children: Not on file   Years of education: Not on file   Highest education level: Not on file  Occupational History   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  Tobacco Use   Smoking status: Former    Current packs/day: 0.00    Types: Cigarettes    Start date: 1990    Quit date: 1995    Years since quitting: 30.0   Smokeless tobacco: Never   Tobacco comments:    Quit in early 30's  Vaping Use   Vaping status: Never Used  Substance and Sexual Activity   Alcohol use: No    Comment: soc   Drug use: No   Sexual activity: Yes    Birth control/protection: Surgical  Other Topics Concern   Not on file  Social History Narrative   Not on file   Social Drivers of Health   Financial Resource Strain: Not on file  Food Insecurity: No Food Insecurity (11/18/2022)   Hunger Vital Sign    Worried About Running Out of Food in the Last Year: Never true    Ran Out of Food in the Last Year: Never true  Transportation Needs: No Transportation Needs (11/18/2022)   PRAPARE - Administrator, Civil Service (Medical): No    Lack of Transportation (Non-Medical): No  Physical Activity: Not on file  Stress: Not on file  Social Connections: Not on file    Current Medications:  Current Outpatient Medications:    amLODipine  (NORVASC ) 10 MG tablet, Take 1 tablet (10 mg total) by mouth daily. (Patient not taking: Reported on 05/25/2023), Disp: 30 tablet, Rfl: 11   atorvastatin  (LIPITOR) 10 MG tablet, Take 1 tablet (10 mg total) by mouth daily. (Patient not taking: Reported on 02/24/2023), Disp: 90 tablet, Rfl: 3   glucose blood (ACCU-CHEK AVIVA PLUS) test strip, Check blood sugar BID  one time early morning fasting and second time after lunch or dinner., Disp: 100 each, Rfl: 12   Iron , Ferrous Sulfate , 325 (65 Fe) MG TABS, Take 325 mg by mouth daily.,  Disp: 30 tablet, Rfl: 1   Lancets (ACCU-CHEK MULTICLIX) lancets, Check blood sugar BID  one time early morning fasting and second time after lunch or dinner., Disp: 100 each, Rfl: 12   levonorgestrel  (MIRENA ) 20 MCG/DAY IUD, 1 each by Intrauterine route once for 1 dose. (Patient not taking: Reported on 03/03/2023), Disp: 1 each, Rfl: 0   losartan  (COZAAR ) 100 MG tablet, Take 1 tablet (100 mg total) by mouth daily. (Patient not taking: Reported on 03/03/2023), Disp: 90 tablet, Rfl: 3   medroxyPROGESTERone  (PROVERA ) 10 MG tablet, Take 1 tablet (10 mg total) by mouth daily. (Patient not taking: Reported on 03/03/2023), Disp: 30 tablet, Rfl: 1   melatonin 3 MG TABS tablet, Take 6 mg by mouth at bedtime as needed (sleep)., Disp: , Rfl:    metFORMIN  (GLUCOPHAGE ) 500 MG tablet, Take 1 tablet (500 mg total) by mouth 2 (two) times daily with a meal. (Patient not taking: Reported on  05/25/2023), Disp: 60 tablet, Rfl: 3  Review of Systems: Denies appetite changes, fevers, chills, fatigue, unexplained weight changes. Denies hearing loss, neck lumps or masses, mouth sores, ringing in ears or voice changes. Denies cough or wheezing.  Denies shortness of breath. Denies chest pain or palpitations. Denies leg swelling. Denies abdominal distention, pain, blood in stools, constipation, diarrhea, nausea, vomiting, or early satiety. Denies pain with intercourse, dysuria, frequency, hematuria or incontinence. Denies hot flashes, pelvic pain, vaginal bleeding or vaginal discharge.   Denies joint pain, back pain or muscle pain/cramps. Denies itching, rash, or wounds. Denies dizziness, headaches, numbness or seizures. Denies swollen lymph nodes or glands, denies easy bruising or bleeding. Denies anxiety, depression, confusion, or decreased concentration.  Physical Exam: There were no vitals taken for this visit. General: ***Alert, oriented, no acute distress. HEENT: ***Posterior oropharynx clear, sclera  anicteric. Chest: ***Clear to auscultation bilaterally.  ***Port site clean. Cardiovascular: ***Regular rate and rhythm, no murmurs. Abdomen: ***Obese, soft, nontender.  Normoactive bowel sounds.  No masses or hepatosplenomegaly appreciated.  ***Well-healed scar. Extremities: ***Grossly normal range of motion.  Warm, well perfused.  No edema bilaterally. Skin: ***No rashes or lesions noted. Lymphatics: ***No cervical, supraclavicular, or inguinal adenopathy. GU: Normal appearing external genitalia without erythema, excoriation, or lesions.  Speculum exam reveals ***.  Bimanual exam reveals ***.  ***Rectovaginal exam  confirms ___.  Laboratory & Radiologic Studies: ***  Assessment & Plan: Hannah Figueroa is a 63 y.o. woman with EIN undergoing hormonal therapy (Mirena  IUD in place since July 2024). Last biopsy in 05/2023 negative for EIN.   Patient is overall doing well without significant symptoms.   Discussed some of her vulvar symptoms which look like some sebaceous cysts.  I think this may be related to change in products recently.  I do not see any erythema or signs of infection.   Patient tolerated her biopsy quite well with premedication today.  She is interested in using premedication as well as some injectable lidocaine  for her next biopsy.   Will call her with biopsy results.  We discussed again today that the goal is to continue to work towards weight loss so that definitive surgery is safer.  She was congratulated on her continued weight loss to date.  *** minutes of total time was spent for this patient encounter, including preparation, face-to-face counseling with the patient and coordination of care, and documentation of the encounter.  Comer Dollar, MD  Division of Gynecologic Oncology  Department of Obstetrics and Gynecology  [facility]

## 2023-08-28 NOTE — Telephone Encounter (Signed)
 Spoke with Ms. Lang and relayed message that we rescheduled her appt. With Dr. Viktoria to 1/17 at 4:15 with arrival time of 4:00 pm. Pt is also requesting medication be sent to her pharmacy prior to her appt. For biopsy. Advised pt her message would be relayed to providers. Pt thanked the office for calling.

## 2023-08-28 NOTE — Telephone Encounter (Signed)
 Spoke with Hannah Figueroa who called the office to cancel her appt. Today with Dr.Tucker due to her husband being called into work because of the weather and now she doesn't have a ride to her appt. Advised patient that the office will call back to reschedule her appt. Pt verbalized understanding and thanked the office.

## 2023-08-31 NOTE — Telephone Encounter (Signed)
 Spoke with Ms. Hannah Figueroa and relayed message from Warner Mccreedy, NP that she sent a Rx for 1 tablet of oxycodone to use prior to her office appt. On 1/17 for her biopsy. Pt verbalized understanding and thanked the office for calling.

## 2023-09-04 ENCOUNTER — Encounter: Payer: Self-pay | Admitting: Gynecologic Oncology

## 2023-09-04 ENCOUNTER — Inpatient Hospital Stay: Payer: BC Managed Care – PPO | Attending: Gynecologic Oncology | Admitting: Gynecologic Oncology

## 2023-09-04 VITALS — BP 164/64 | HR 72 | Temp 97.7°F | Resp 20 | Wt 308.0 lb

## 2023-09-04 DIAGNOSIS — Z79890 Hormone replacement therapy (postmenopausal): Secondary | ICD-10-CM | POA: Insufficient documentation

## 2023-09-04 DIAGNOSIS — N8502 Endometrial intraepithelial neoplasia [EIN]: Secondary | ICD-10-CM | POA: Diagnosis not present

## 2023-09-04 DIAGNOSIS — N858 Other specified noninflammatory disorders of uterus: Secondary | ICD-10-CM | POA: Diagnosis not present

## 2023-09-04 DIAGNOSIS — Z6841 Body Mass Index (BMI) 40.0 and over, adult: Secondary | ICD-10-CM | POA: Diagnosis not present

## 2023-09-04 DIAGNOSIS — Z975 Presence of (intrauterine) contraceptive device: Secondary | ICD-10-CM | POA: Insufficient documentation

## 2023-09-04 NOTE — Progress Notes (Signed)
Gynecologic Oncology Return Clinic Visit  09/04/23  Reason for Visit: surveillance   Treatment History: The patient was initially seen in October 2020 with daily postmenopausal bleeding for 3 years.  Pathology at that time showed both an endometrial polyp as well as an endocervical polyp without hyperplasia or malignancy.  Patient then represented in February of this year with continued daily bleeding although less heavy than it had been.   She was prescribed Megace at a couple of points previously but lost the prescription each time.   10/08/22: Endometrial biopsy performed in the office showed benign endometrial polyp with hyperplasia without atypia.  No malignancy. 10/08/2022: Pelvic ultrasound exam shows a uterus measuring 10.7 x 6.1 x 7.2 with a heterogeneous myometrium.  Hypoechoic soft tissue mass seen in the anterior right uterus measuring up to 5 cm, potentially an exophytic fibroid although incompletely characterized.  Endometrium measures 13 mm.  Neither ovary definitively seen. 12/08/2022: Patient underwent hysteroscopy with polypectomy, D&C.  Plan had been for endometrial ablation with the NovaSure.  Device would not fully open the cavity so endometrial ablation was abandoned. Endometrial curettage showed benign inactive endometrium, scant fragments of benign endometrial polyp, no hyperplasia or malignancy.  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  There are rare foci concerning for well-differentiated endometrial adenocarcinoma.   Her PMH is notable for DM2. Last hemoglocin A1c was 10.5%.  She notes that since starting her diet, her blood glucose has ranged between 118 and 130.  She is currently on metformin managed by her primary care provider.   Decision made to proceed with Mirena IUD placement for treatment while working on DM2 control, weight loss. Given delay in obtaining IUD, the patient was started on provera 10 mg every day on 01/28/23.    03/04/23: Hysteroscopy with endometrial sampling using the MyoSure, Mirena IUD insertion.  Simple and complex hyperplasia with atypia involving a polyp.  05/29/23: Endometrial biopsy reveals inactive endometrium with stromal progestational changes, no EIN or malignancy.  Interval History: Overall doing well.  Has sporadic milky discharge, very rare spotting.  Denies any pelvic pain or cramping.  Reports normal bowel function.  Past Medical/Surgical History: Past Medical History:  Diagnosis Date   Anemia    Asthma 11/15/2015   hospitalized as a child, uses daily steroid, rare rescue inhaler use   Diabetes mellitus without complication (HCC)    Esophageal reflux 11/15/2015   Hypertension    Pneumonia    Sleep apnea     Past Surgical History:  Procedure Laterality Date   CESAREAN SECTION     x2   CHOLECYSTECTOMY     DILATATION & CURETTAGE/HYSTEROSCOPY WITH MYOSURE  12/10/2022   Procedure: DILATATION & CURETTAGE, HYSTEROSCOPY, POLYPECTOMY  WITH MYOSURE;  Surgeon: Willodean Rosenthal, MD;  Location: [facility] OR;  Service: Gynecology;;   DILATATION & CURETTAGE/HYSTEROSCOPY WITH MYOSURE N/A 03/04/2023   Procedure: DILATATION & CURETTAGE/HYSTEROSCOPY WITH MYOSURE;  Surgeon: Carver Fila, MD;  Location: [facility];  Service: Gynecology;  Laterality: N/A;   HYSTEROSCOPY WITH NOVASURE  12/10/2022   Procedure: HYSTEROSCOPY WITH ATTEMPTED NOVASURE;  Surgeon: Willodean Rosenthal, MD;  Location: [facility] OR;  Service: Gynecology;;  DEVICE UNABLE TO REACH UTERINE CAVITY   INTRAUTERINE DEVICE (IUD) INSERTION N/A 03/04/2023   Procedure: INTRAUTERINE DEVICE (IUD) INSERTION, MIRENA;  Surgeon: Carver Fila, MD;  Location: [facility];  Service: Gynecology;  Laterality: N/A;   TONSILLECTOMY     TUBAL LIGATION      Family History  Problem Relation Age  of Onset   Hypertension Mother    Diabetes Mother    Asthma Father    Diabetes Father    Heart attack Father    Ovarian cancer Sister        30  years ago, died at age 28   Colon cancer Neg Hx    Breast cancer Neg Hx    Endometrial cancer Neg Hx    Pancreatic cancer Neg Hx    Prostate cancer Neg Hx     Social History   Socioeconomic History   Marital status: Married    Spouse name: Not on file   Number of children: Not on file   Years of education: Not on file   Highest education level: Not on file  Occupational History   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  Tobacco Use   Smoking status: Former    Current packs/day: 0.00    Types: Cigarettes    Start date: 1990    Quit date: 1995    Years since quitting: 30.0   Smokeless tobacco: Never   Tobacco comments:    Quit in early 30's  Vaping Use   Vaping status: Never Used  Substance and Sexual Activity   Alcohol use: No    Comment: soc   Drug use: No   Sexual activity: Yes    Birth control/protection: Surgical  Other Topics Concern   Not on file  Social History Narrative   Not on file   Social Drivers of Health   Financial Resource Strain: Not on file  Food Insecurity: No Food Insecurity (11/18/2022)   Hunger Vital Sign    Worried About Running Out of Food in the Last Year: Never true    Ran Out of Food in the Last Year: Never true  Transportation Needs: No Transportation Needs (11/18/2022)   PRAPARE - Administrator, Civil Service (Medical): No    Lack of Transportation (Non-Medical): No  Physical Activity: Not on file  Stress: Not on file  Social Connections: Not on file    Current Medications:  Current Outpatient Medications:    amLODipine (NORVASC) 10 MG tablet, Take 1 tablet (10 mg total) by mouth daily., Disp: 30 tablet, Rfl: 11   glucose blood (ACCU-CHEK AVIVA PLUS) test strip, Check blood sugar BID  one time early morning fasting and second time after lunch or dinner., Disp: 100 each, Rfl: 12   Iron, Ferrous Sulfate, 325 (65 Fe) MG TABS, Take 325 mg by mouth daily., Disp: 30 tablet, Rfl: 1   Lancets (ACCU-CHEK MULTICLIX)  lancets, Check blood sugar BID  one time early morning fasting and second time after lunch or dinner., Disp: 100 each, Rfl: 12   levonorgestrel (MIRENA) 20 MCG/DAY IUD, 1 each by Intrauterine route once for 1 dose., Disp: 1 each, Rfl: 0   melatonin 3 MG TABS tablet, Take 6 mg by mouth at bedtime as needed (sleep)., Disp: , Rfl:    metFORMIN (GLUCOPHAGE) 500 MG tablet, Take 1 tablet (500 mg total) by mouth 2 (two) times daily with a meal., Disp: 60 tablet, Rfl: 3   oxyCODONE (OXY IR/ROXICODONE) 5 MG immediate release tablet, Take 1 tablet (5 mg total) by mouth once for 1 dose. Take one tablet by mouth 30 minutes before visit. You must have a driver to and from your appt., Disp: 1 tablet, Rfl: 0   atorvastatin (LIPITOR) 10 MG tablet, Take 1 tablet (10 mg total) by mouth daily. (Patient not taking: Reported on 02/24/2023), Disp:  90 tablet, Rfl: 3  Review of Systems: Denies appetite changes, fevers, chills, fatigue, unexplained weight changes. Denies hearing loss, neck lumps or masses, mouth sores, ringing in ears or voice changes. Denies cough or wheezing.  Denies shortness of breath. Denies chest pain or palpitations. Denies leg swelling. Denies abdominal distention, pain, blood in stools, constipation, diarrhea, nausea, vomiting, or early satiety. Denies pain with intercourse, dysuria, frequency, hematuria or incontinence. Denies hot flashes, pelvic pain, vaginal bleeding or vaginal discharge.   Denies joint pain, back pain or muscle pain/cramps. Denies itching, rash, or wounds. Denies dizziness, headaches, numbness or seizures. Denies swollen lymph nodes or glands, denies easy bruising or bleeding. Denies anxiety, depression, confusion, or decreased concentration.  Physical Exam: BP (!) 164/64 (BP Location: Left Arm, Patient Position: Sitting) Comment: Notified RN  Pulse 72   Temp 97.7 F (36.5 C) (Oral)   Resp 20   Wt (!) 308 lb (139.7 kg)   SpO2 99%   BMI 51.25 kg/m  General: Alert,  oriented, no acute distress. HEENT: Normocephalic, atraumatic, sclera anicteric. Chest: Unlabored breathing on room air. Abdomen: Obese, soft, nontender.  Normoactive bowel sounds.  No masses or hepatosplenomegaly appreciated.   Extremities: Grossly normal range of motion.  Warm, well perfused.  No edema bilaterally.   GU: Normal appearing external genitalia without erythema, excoriation, or lesions.  Speculum exam reveals no blood or discharge within the vagina.  IUD string seen protruding approximately 4 cm from the cervix.   Endometrial biopsy procedure Preoperative diagnosis: EIN Postoperative diagnosis: Same as above Physician: Pricilla Holm MD Estimated blood loss: Minimal Specimens: Endometrial biopsy Procedure: After the procedure was discussed with the patient including risks and benefits, she gave verbal consent.  She was then placed in dorsolithotomy position and a speculum was placed in the vagina.  Once the cervix was well visualized it was cleansed with Betadine x3.  Tenaculum was placed on the anterior lip of the cervix.  An endometrial Pipelle was then passed to a depth of just over 8 cm.  1 pass was performed with sufficient tissue obtained.  This was placed in formalin.  Overall the patient tolerated the procedure well.  All instruments were removed from the vagina.  Laboratory & Radiologic Studies: None new  Assessment & Plan: Hannah Figueroa is a 63 y.o. woman with with EIN undergoing hormonal therapy (now Mirena IUD in place since July 2024). Last biopsy in 05/2023 without hyperplasia, atypia or malignancy.   Patient is overall doing well without significant symptoms.   Patient tolerated her biopsy well with premedication today.  She is interested in using premedication for her future biopsies.   Will call her with biopsy results.  We discussed again today that the goal is to continue to work towards weight loss so that definitive surgery is safer.  Discussed again that last  biopsy was negative for any hyperplasia, atypia or malignancy.  If this biopsy is also negative, can consider longer interval before next biopsy given her difficulty with biopsies as long as no significant change to symptoms.  22 minutes of total time was spent for this patient encounter, including preparation, face-to-face counseling with the patient and coordination of care, and documentation of the encounter.  Eugene Garnet, MD  Division of Gynecologic Oncology  Department of Obstetrics and Gynecology  [facility] of [facility]

## 2023-09-04 NOTE — Addendum Note (Signed)
Addended by: Carver Fila on: 09/04/2023 04:45 PM   Modules accepted: Orders

## 2023-09-04 NOTE — Patient Instructions (Signed)
It was good to see you today.  I will let you know when I get your biopsy results back next week.  I will see you for follow-up in 3 months.  Depending on biopsy results, we may be able to change this follow-up visit to 6 months from now.  As always, if you develop any new and concerning symptoms before your next visit, please call to see me sooner.

## 2023-09-23 ENCOUNTER — Telehealth: Payer: Self-pay

## 2023-09-23 NOTE — Telephone Encounter (Signed)
 Left voicemail for patient to call back and schedule her annual exam.

## 2023-09-29 ENCOUNTER — Telehealth: Payer: Self-pay

## 2023-09-29 LAB — SURGICAL PATHOLOGY

## 2023-09-29 NOTE — Telephone Encounter (Signed)
I called pathology and spoke to [facility]. He states he is not sure what happened and says he is sorry. He is sending it to Dr. Luisa Hart to be read.   Warner Mccreedy NP notified

## 2023-09-29 NOTE — Telephone Encounter (Signed)
Ms.Dikes called office stating she was seen in January by Dr.Tucker. She had a biopsy at that time and has not received results. Please call her at [phone].

## 2023-09-30 ENCOUNTER — Encounter: Payer: Self-pay | Admitting: Gynecologic Oncology

## 2023-09-30 ENCOUNTER — Telehealth: Payer: Self-pay | Admitting: Gynecologic Oncology

## 2023-09-30 NOTE — Telephone Encounter (Signed)
Called patient to discussed recent EMB. NO answer. Left VM requesting callback.  Eugene Garnet MD Gynecologic Oncology

## 2023-10-02 ENCOUNTER — Encounter: Payer: Self-pay | Admitting: Gynecologic Oncology

## 2023-12-04 ENCOUNTER — Ambulatory Visit: Payer: BC Managed Care – PPO | Admitting: Gynecologic Oncology

## 2024-01-06 ENCOUNTER — Telehealth: Payer: Self-pay

## 2024-01-06 NOTE — Telephone Encounter (Signed)
 Hannah Figueroa called from [facility] in regards to the Mirena  IUD which was ordered for insertion on 03/04/23 by Dr.Tucker. Representative states the diagnosis code that was submitted (C54.1) is not FDA approved. She states the provider can go to reimbursement codes.com and get a similar code that is FDA approved. She can be reached at [phone].   Aware message will be sent to Vira Grieves NP and I will call her back with requested information.

## 2024-01-08 ENCOUNTER — Inpatient Hospital Stay: Payer: BC Managed Care – PPO | Admitting: Gynecologic Oncology

## 2024-01-08 ENCOUNTER — Telehealth: Payer: Self-pay | Admitting: *Deleted

## 2024-01-08 NOTE — Progress Notes (Unsigned)
 Patient called same day and cancelled/rescheduled her appt

## 2024-01-08 NOTE — Telephone Encounter (Signed)
 Spoke with Ms. Renken this morning, pt called to rescheduled her appt. Today with Dr. Orvil Bland due to personal reasons. Pt was given a new appt. On Thursday, July 10 th at 3:15  pm. Pt agreed to date and time and had no further concerns at this time.

## 2024-02-04 ENCOUNTER — Encounter: Payer: Self-pay | Admitting: Medical

## 2024-02-24 ENCOUNTER — Telehealth: Payer: Self-pay

## 2024-02-24 ENCOUNTER — Other Ambulatory Visit: Payer: Self-pay | Admitting: Gynecologic Oncology

## 2024-02-24 DIAGNOSIS — R102 Pelvic and perineal pain: Secondary | ICD-10-CM

## 2024-02-24 MED ORDER — OXYCODONE HCL 10 MG PO TABS
10.0000 mg | ORAL_TABLET | Freq: Once | ORAL | 0 refills | Status: AC
Start: 2024-02-25 — End: 2024-02-25

## 2024-02-24 NOTE — Telephone Encounter (Signed)
 Hannah Figueroa has an appointment tomorrow with Dr.Tucker, pt requesting pre-med Oxycodone  10mg  be sent to her pharmacy.   Message sent to Eleanor Epps NP

## 2024-02-24 NOTE — Telephone Encounter (Signed)
 LVM for Hannah Figueroa to call office regarding message from Eleanor Epps NP about the Oxycodone .

## 2024-02-25 ENCOUNTER — Inpatient Hospital Stay: Attending: Gynecologic Oncology | Admitting: Gynecologic Oncology

## 2024-02-25 ENCOUNTER — Encounter: Payer: Self-pay | Admitting: Gynecologic Oncology

## 2024-02-25 VITALS — BP 140/70 | HR 78 | Temp 98.4°F | Resp 20 | Wt 315.6 lb

## 2024-02-25 DIAGNOSIS — Z975 Presence of (intrauterine) contraceptive device: Secondary | ICD-10-CM | POA: Diagnosis not present

## 2024-02-25 DIAGNOSIS — Z79890 Hormone replacement therapy (postmenopausal): Secondary | ICD-10-CM | POA: Diagnosis not present

## 2024-02-25 DIAGNOSIS — N8502 Endometrial intraepithelial neoplasia [EIN]: Secondary | ICD-10-CM | POA: Diagnosis not present

## 2024-02-25 NOTE — Patient Instructions (Signed)
 It was good to see you today.    I will let you know when I get your biopsy results back.  I will see you for follow-up in 6 months.  As always, if you develop any new and concerning symptoms before your next visit, please call to see me sooner.

## 2024-02-25 NOTE — Progress Notes (Signed)
 Gynecologic Oncology Return Clinic Visit  02/25/24  Reason for Visit:  surveillance   Treatment History: The patient was initially seen in October 2020 with daily postmenopausal bleeding for 3 years.  Pathology at that time showed both an endometrial polyp as well as an endocervical polyp without hyperplasia or malignancy.  Patient then represented in February of this year with continued daily bleeding although less heavy than it had been.   She was prescribed Megace  at a couple of points previously but lost the prescription each time.   10/08/22: Endometrial biopsy performed in the office showed benign endometrial polyp with hyperplasia without atypia.  No malignancy. 10/08/2022: Pelvic ultrasound exam shows a uterus measuring 10.7 x 6.1 x 7.2 with a heterogeneous myometrium.  Hypoechoic soft tissue mass seen in the anterior right uterus measuring up to 5 cm, potentially an exophytic fibroid although incompletely characterized.  Endometrium measures 13 mm.  Neither ovary definitively seen. 12/08/2022: Patient underwent hysteroscopy with polypectomy, D&C.  Plan had been for endometrial ablation with the NovaSure.  Device would not fully open the cavity so endometrial ablation was abandoned. Endometrial curettage showed benign inactive endometrium, scant fragments of benign endometrial polyp, no hyperplasia or malignancy.  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  There are rare foci concerning for well-differentiated endometrial adenocarcinoma.   Her PMH is notable for DM2. Last hemoglocin A1c was 10.5%.  She notes that since starting her diet, her blood glucose has ranged between 118 and 130.  She is currently on metformin  managed by her primary care provider.   Decision made to proceed with Mirena  IUD placement for treatment while working on DM2 control, weight loss. Given delay in obtaining IUD, the patient was started on provera  10 mg every day on 01/28/23.    03/04/23: Hysteroscopy with endometrial sampling using the MyoSure, Mirena  IUD insertion.  Simple and complex hyperplasia with atypia involving a polyp.   05/29/23: Endometrial biopsy reveals inactive endometrium with stromal progestational changes, no EIN or malignancy.  09/04/23: Endometrial biopsy with inactive endometrial glands with diffuse stromal progestational changes. Negative for endometrial intraepithelial neoplasia and malignancy.   Interval History: Overall doing well.  Notes some continued vaginal discharge intermittently.  Also sometimes notices a egg smell that comes and goes, unchanged.  Has some vaginal dryness, similar hot flashes.  Denies any pelvic pain or cramping.  Stopped the weight loss program that she was on around the time I saw her last.  Has gained about 6 pounds in the last 6 months but feels that she is able to make better choices about food.  Has been checking her blood pressure at home.  It is typically 115-120/70s-80s.  Has had some intermittent palpitations.  She has a history of this but it improved.  Unsure if this is related to stress/anxiety from work.  Encouraged her to reach out to her primary care provider, who had seen her before for the symptoms.  Past Medical/Surgical History: Past Medical History:  Diagnosis Date   Anemia    Asthma 11/15/2015   hospitalized as a child, uses daily steroid, rare rescue inhaler use   Diabetes mellitus without complication (HCC)    Esophageal reflux 11/15/2015   Hypertension    Pneumonia    Sleep apnea     Past Surgical History:  Procedure Laterality Date   CESAREAN SECTION     x2   CHOLECYSTECTOMY     DILATATION & CURETTAGE/HYSTEROSCOPY WITH MYOSURE  12/10/2022   Procedure: DILATATION &  CURETTAGE, HYSTEROSCOPY, POLYPECTOMY  WITH MYOSURE;  Surgeon: Corene Coy, MD;  Location: [facility] OR;  Service: Gynecology;;   DILATATION & CURETTAGE/HYSTEROSCOPY WITH MYOSURE N/A 03/04/2023   Procedure:  DILATATION & CURETTAGE/HYSTEROSCOPY WITH MYOSURE;  Surgeon: Viktoria Comer SAUNDERS, MD;  Location: [facility] ORS;  Service: Gynecology;  Laterality: N/A;   HYSTEROSCOPY WITH NOVASURE  12/10/2022   Procedure: HYSTEROSCOPY WITH ATTEMPTED NOVASURE;  Surgeon: Corene Coy, MD;  Location: [facility] OR;  Service: Gynecology;;  DEVICE UNABLE TO REACH UTERINE CAVITY   INTRAUTERINE DEVICE (IUD) INSERTION N/A 03/04/2023   Procedure: INTRAUTERINE DEVICE (IUD) INSERTION, MIRENA ;  Surgeon: Viktoria Comer SAUNDERS, MD;  Location: [facility] ORS;  Service: Gynecology;  Laterality: N/A;   TONSILLECTOMY     TUBAL LIGATION      Family History  Problem Relation Age of Onset   Hypertension Mother    Diabetes Mother    Asthma Father    Diabetes Father    Heart attack Father    Ovarian cancer Sister        30 years ago, died at age 59   Colon cancer Neg Hx    Breast cancer Neg Hx    Endometrial cancer Neg Hx    Pancreatic cancer Neg Hx    Prostate cancer Neg Hx     Social History   Socioeconomic History   Marital status: Married    Spouse name: Not on file   Number of children: Not on file   Years of education: Not on file   Highest education level: Not on file  Occupational History   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  Tobacco Use   Smoking status: Former    Current packs/day: 0.00    Types: Cigarettes    Start date: 1990    Quit date: 1995    Years since quitting: 30.5   Smokeless tobacco: Never   Tobacco comments:    Quit in early 30's  Vaping Use   Vaping status: Never Used  Substance and Sexual Activity   Alcohol use: No    Comment: soc   Drug use: No   Sexual activity: Yes    Birth control/protection: Surgical  Other Topics Concern   Not on file  Social History Narrative   Not on file   Social Drivers of Health   Financial Resource Strain: Not on file  Food Insecurity: No Food Insecurity (11/18/2022)   Hunger Vital Sign    Worried About Running Out of Food in the Last Year:  Never true    Ran Out of Food in the Last Year: Never true  Transportation Needs: No Transportation Needs (11/18/2022)   PRAPARE - Administrator, Civil Service (Medical): No    Lack of Transportation (Non-Medical): No  Physical Activity: Not on file  Stress: Not on file  Social Connections: Not on file    Current Medications:  Current Outpatient Medications:    amLODipine  (NORVASC ) 10 MG tablet, Take 1 tablet (10 mg total) by mouth daily., Disp: 30 tablet, Rfl: 11   atorvastatin  (LIPITOR) 10 MG tablet, Take 1 tablet (10 mg total) by mouth daily., Disp: 90 tablet, Rfl: 3   glucose blood (ACCU-CHEK AVIVA PLUS) test strip, Check blood sugar BID  one time early morning fasting and second time after lunch or dinner., Disp: 100 each, Rfl: 12   Iron , Ferrous Sulfate , 325 (65 Fe) MG TABS, Take 325 mg by mouth daily., Disp: 30 tablet, Rfl: 1   Lancets (ACCU-CHEK MULTICLIX) lancets, Check blood sugar  BID  one time early morning fasting and second time after lunch or dinner., Disp: 100 each, Rfl: 12   levonorgestrel  (MIRENA ) 20 MCG/DAY IUD, 1 each by Intrauterine route once for 1 dose., Disp: 1 each, Rfl: 0   melatonin 3 MG TABS tablet, Take 6 mg by mouth at bedtime as needed (sleep)., Disp: , Rfl:    metFORMIN  (GLUCOPHAGE ) 500 MG tablet, Take 1 tablet (500 mg total) by mouth 2 (two) times daily with a meal., Disp: 60 tablet, Rfl: 3   Oxycodone  HCl 10 MG TABS, Take 1 tablet (10 mg total) by mouth once for 1 dose. Take 30 minutes prior to exam with Dr. Viktoria, do not take and drive, Disp: 1 tablet, Rfl: 0  Review of Systems: + palpitations, hot flashes, vaginal discharge Denies appetite changes, fevers, chills, fatigue, unexplained weight changes. Denies hearing loss, neck lumps or masses, mouth sores, ringing in ears or voice changes. Denies cough or wheezing.  Denies shortness of breath. Denies chest pain. Denies leg swelling. Denies abdominal distention, pain, blood in stools,  constipation, diarrhea, nausea, vomiting, or early satiety. Denies pain with intercourse, dysuria, frequency, hematuria or incontinence. Denies pelvic pain, vaginal bleeding.   Denies joint pain, back pain or muscle pain/cramps. Denies itching, rash, or wounds. Denies dizziness, headaches, numbness or seizures. Denies swollen lymph nodes or glands, denies easy bruising or bleeding. Denies anxiety, depression, confusion, or decreased concentration.  Physical Exam: BP (!) 192/71 (BP Location: Right Arm, Patient Position: Sitting) Comment: Notified RN  Pulse 78   Temp 98.4 F (36.9 C) (Oral)   Resp 20   Wt (!) 315 lb 9.6 oz (143.2 kg)   SpO2 98%   BMI 52.52 kg/m  General: Alert, oriented, no acute distress. HEENT: Normocephalic, atraumatic, sclera anicteric. Chest: Unlabored breathing on room air.  Lungs clear to auscultation bilaterally. Cardiovascular: Regular rate and rhythm, no murmurs or rubs appreciated. Abdomen: Obese, soft, nontender.  Normoactive bowel sounds.  No masses or hepatosplenomegaly appreciated.   Extremities: Grossly normal range of motion.  Warm, well perfused.  No edema bilaterally. GU: Normal appearing external genitalia without erythema, excoriation, or lesions.  Speculum exam reveals no blood or discharge within the vagina.  IUD string seen protruding approximately 4 cm from the cervix.   Endometrial biopsy procedure Preoperative diagnosis: EIN Postoperative diagnosis: Same as above Physician: Viktoria MD Estimated blood loss: Minimal Specimens: Endometrial biopsy Procedure: After the procedure was discussed with the patient including risks and benefits, she gave consent.  She was then placed in dorsolithotomy position and a speculum was placed in the vagina.  Once the cervix was well visualized it was cleansed with Betadine x3.  Tenaculum was placed on the anterior lip of the cervix.  An endometrial Pipelle was then passed to a depth of just over 8 cm. 1 pass was  performed with sufficient tissue obtained.  This was placed in formalin.  Overall the patient tolerated the procedure well.  All instruments were removed from the vagina.  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 Last biopsy in 1/25 without hyperplasia, atypia or malignancy.   Patient is overall doing well without significant symptoms.   Patient tolerated her biopsy well with premedication today.  If biopsy from today is negative, this will make 3 negative biopsies since we started progesterone treatment.  I will tentatively plan to see her  in 6 months.  Could delay repeat biopsy for 9-12 months if no change in symptoms.   Reviewed again interest in surgery.  She ultimately would like to undergo surgery.  We discussed the importance of weight loss both in terms of decreasing her endogenous estrogen production but also in terms of making surgery less morbid.  She is motivated to work towards weight loss.  We set a goal of losing 20-30 pounds between now and her next visit with me in 6 months.  22 minutes of total time was spent for this patient encounter, including preparation, face-to-face counseling with the patient and coordination of care, and documentation of the encounter.  Comer Dollar, MD  Division of Gynecologic Oncology  Department of Obstetrics and Gynecology  [facility]

## 2024-03-01 ENCOUNTER — Ambulatory Visit: Payer: Self-pay | Admitting: Gynecologic Oncology

## 2024-03-01 LAB — SURGICAL PATHOLOGY

## 2024-08-26 ENCOUNTER — Ambulatory Visit: Admitting: Gynecologic Oncology

## 2024-10-14 ENCOUNTER — Inpatient Hospital Stay: Admitting: Gynecologic Oncology
